# Patient Record
Sex: Male | Born: 1980 | Race: White | Hispanic: No | Marital: Married | Smoking: Former smoker
Health system: Southern US, Community
[De-identification: ages and names within clinical notes are randomized; demographics above are authoritative.]

## PROBLEM LIST (undated history)

## (undated) DIAGNOSIS — G8929 Other chronic pain: Secondary | ICD-10-CM

## (undated) DIAGNOSIS — M549 Dorsalgia, unspecified: Secondary | ICD-10-CM

## (undated) DIAGNOSIS — J45909 Unspecified asthma, uncomplicated: Secondary | ICD-10-CM

## (undated) HISTORY — PX: VASCULAR SURGERY: SHX849

## (undated) HISTORY — PX: SPINAL CORD STIMULATOR INSERTION: SHX5378

## (undated) HISTORY — PX: APPENDECTOMY: SHX54

## (undated) HISTORY — PX: RADIOFREQUENCY ABLATION NERVES: SUR1070

## (undated) HISTORY — PX: HAND SURGERY: SHX662

## (undated) HISTORY — PX: CHOLECYSTECTOMY: SHX55

## (undated) HISTORY — PX: OTHER SURGICAL HISTORY: SHX169

---

## 2000-10-19 ENCOUNTER — Encounter: Payer: Self-pay | Admitting: Emergency Medicine

## 2000-10-19 ENCOUNTER — Emergency Department (HOSPITAL_COMMUNITY): Admission: EM | Admit: 2000-10-19 | Discharge: 2000-10-19 | Payer: Self-pay | Admitting: Emergency Medicine

## 2001-06-03 ENCOUNTER — Emergency Department (HOSPITAL_COMMUNITY): Admission: EM | Admit: 2001-06-03 | Discharge: 2001-06-03 | Payer: Self-pay | Admitting: Emergency Medicine

## 2002-01-20 ENCOUNTER — Encounter: Payer: Self-pay | Admitting: *Deleted

## 2002-01-20 ENCOUNTER — Encounter: Admission: RE | Admit: 2002-01-20 | Discharge: 2002-01-20 | Payer: Self-pay | Admitting: *Deleted

## 2002-02-03 ENCOUNTER — Ambulatory Visit (HOSPITAL_COMMUNITY): Admission: RE | Admit: 2002-02-03 | Discharge: 2002-02-03 | Payer: Self-pay | Admitting: *Deleted

## 2006-04-28 ENCOUNTER — Emergency Department (HOSPITAL_COMMUNITY): Admission: EM | Admit: 2006-04-28 | Discharge: 2006-04-28 | Payer: Self-pay | Admitting: Emergency Medicine

## 2006-08-25 ENCOUNTER — Encounter: Admission: RE | Admit: 2006-08-25 | Discharge: 2006-08-25 | Payer: Self-pay | Admitting: Family Medicine

## 2006-08-25 ENCOUNTER — Encounter (INDEPENDENT_AMBULATORY_CARE_PROVIDER_SITE_OTHER): Payer: Self-pay | Admitting: General Surgery

## 2006-08-25 ENCOUNTER — Inpatient Hospital Stay (HOSPITAL_COMMUNITY): Admission: EM | Admit: 2006-08-25 | Discharge: 2006-08-26 | Payer: Self-pay | Admitting: Emergency Medicine

## 2006-11-09 ENCOUNTER — Encounter: Admission: RE | Admit: 2006-11-09 | Discharge: 2006-11-09 | Payer: Self-pay | Admitting: Family Medicine

## 2010-07-08 NOTE — H&P (Signed)
[name], [name]               ACCOUNT NO.:  [id number]   MEDICAL RECORD NO.:  [id number]          PATIENT TYPE:  INP   LOCATION:  1425                         FACILITY:  [facility]   PHYSICIAN:  Anselm Pancoast. Weatherly, M.D.DATE OF BIRTH:  [birth year]   DATE OF ADMISSION:  08/25/2006  DATE OF DISCHARGE:                              HISTORY & PHYSICAL   CHIEF COMPLAINT:  Abdominal pain worse right lower quadrant.   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  This morning, it was more  intense and he was seen by the physician's assistant at Dr. Alric Seton office who found him mildly tender in the lower abdomen.  A  CBC was obtained which was mildly elevated and then he was referred for  a CAT scan.  The CAT scan showed findings of acute appendicitis.  I was  called and he was sent to the emergency room and on questioning, he  states he has never had any similar pain like this and he has no  allergies.  The patient was given 3 gm of Unasyn at approximately 5  o'clock and received some pain medications.   PAST MEDICAL HISTORY:  He has got a history of asthma.  He is a  nonsmoker and nondrinker.  No use of recreational drugs.  Only previous  surgery was an I&D of a pilonidal cyst in the medical office several  years ago.   CHRONIC MEDICATIONS:  None.   PHYSICAL EXAMINATION:  GENERAL:  He is a pleasant male who appears his  stated age and he was kind of complaining of abdominal pain but it was  certainly not generalized acute abdomen.  VITAL SIGNS:  Blood pressure is 126/78.  Pulse 69.  He is afebrile at  97.6.  Respirations 20.  EYES, EARS, NOSE, AND THROAT:  He appears adequately hydrated.  There is  no cervical or supraclavicular lymphadenopathy.  LUNGS:  Clear.  CARDIAC:  Normal sinus rhythm.  ABDOMEN:  He is definitely mildly tender in  the right lower quadrant at  McBurney's point.  There are no umbilical hernias and no inguinal  hernias.  He has a fairly deep umbilicus but the fascia appears intact.  EXTREMITIES:  Unremarkable.  CNS:  Physiologic.   ADMISSION IMPRESSION:  Acute appendicitis confirmed with CT.   PLAN:  We will proceed to do a laparoscopic appendectomy when OR time is  available.           ______________________________  Anselm Pancoast. Zachery Dakins, M.D.     WJW/MEDQ  D:  08/25/2006  T:  08/26/2006  Job:  [id number]

## 2010-07-08 NOTE — Op Note (Signed)
[name] [name], MURTHA               ACCOUNT NO.:  [id number]   MEDICAL RECORD NO.:  [id number]          PATIENT TYPE:  INP   LOCATION:  1425                         FACILITY:  [facility]   PHYSICIAN:  Anselm Pancoast. Weatherly, M.D.DATE OF BIRTH:  01-Mar-1980   DATE OF PROCEDURE:  08/25/2006  DATE OF DISCHARGE:                               OPERATIVE REPORT   PREOPERATIVE DIAGNOSIS:  Acute appendicitis.   POSTOPERATIVE DIAGNOSIS:  Acute appendicitis.   OPERATIONS:  Laparoscopic appendectomy.   General anesthesia.   SURGEON:  Anselm Pancoast. Zachery Dakins, M.D.   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  This morning was more  intense and appeared to be shifting to the lower abdomen.  He was seen  in Dr. Alric Seton office and a mildly elevated white count and  then referred for CT that showed acute appendicitis.  The patient  presented to the emergency room and was seen about 5 o'clock, given 3 g  of Unasyn and permission obtained now for a laparoscopic appendectomy.   The patient was taken to the operative suite.  After induction of  general anesthesia and endotracheal tube, the abdomen was first clipped  and then prepped with Betadine solution and draped in a sterile manner.  I made a small vertical incision above the umbilicus.  The fascia was  identified.  This was picked up between two Kochers and very small  opening carefully made into the peritoneal cavity.  A suture superior  and inferior of 0 Vicryl was placed and a Hasson cannula introduced.  The the patient has an obvious inflammatory process in the right lower  quadrant but it does not look like problems with a ruptured appendix.  The 5 mm port was placed in the right upper quadrant with direct vision  and then the 10/11 was placed the left lower quadrant under direct  vision.  Camera was then the left lower quadrant and the cecum was kind  of rotated.   You could see the markedly inflamed appendix.  This was  grasped with a Kingsley Spittle and then the appendix was kind of looped over  but I could identify which was the tip and which was the base of the  cecum and then the appendiceal mesentery which was adherent, as if he  has had some previous episodes of discomfort, were carefully dissected  with the harmonic scalpel.  The appendix was then straightened out so  that the appendiceal mesentery could be divided with the harmonic  scalpel and good hemostasis was obtained.  The appendix was inflamed  right on to about an inch of its junction with the cecum and I took the  mesentery down to the cecum, then used a vascular GIA across the base.  The first staple did not fire.  removed it, got a new stapler.  This one  was reapplied just slightly more proximal and fired nicely.  Good  hemostasis, and the appendix was placed in the EndoCatch bag.  The bag  containing the inflamed appendix was removed and then  the Hasson cannula  reinserted.  Reinspected of all where the appendix had been removed, and  the mesentery revealed good hemostasis, good closure.  A little bit of  irrigating fluid was used and aspirated and then the 10/11 port was  first removed under direct vision and I put a 0 Vicryl figure-of-eight  suture in the anterior fascia.  There was no bleeding on checking on  entering the abdomen.  The 5-mm port was removed next, then the [facility]  cannula was removed.  I placed a figure-of-eight in the fascia in  addition to the two previous U stitches, and all of his 0 Vicryl  sutures were tied.  The fascia at the umbilicus and the left lower  quadrant was anesthetized.  The subcutaneous wounds were closed with 4-0  Vicryl, Benzoin and Steri-Strips on the skin.  The patient tolerated the  procedure nicely and, hopefully, will be ready to be discharged in the  a.m.           ______________________________  Anselm Pancoast. Zachery Dakins, M.D.      WJW/MEDQ  D:  08/25/2006  T:  08/26/2006  Job:  [id number]   cc:   Donia Guiles, M.D.  Fax: [phone]

## 2010-12-09 LAB — DIFFERENTIAL
Basophils Absolute: 0
Basophils Relative: 0
Eosinophils Relative: 1
Lymphocytes Relative: 20
Monocytes Absolute: 0.7
Monocytes Relative: 8

## 2010-12-09 LAB — CBC
HCT: 41.9
Hemoglobin: 14.2
MCHC: 33.9
RBC: 4.98
RDW: 12.8

## 2016-06-17 ENCOUNTER — Other Ambulatory Visit: Payer: Self-pay | Admitting: Anesthesiology

## 2016-06-17 DIAGNOSIS — M5137 Other intervertebral disc degeneration, lumbosacral region: Secondary | ICD-10-CM

## 2016-06-30 DIAGNOSIS — G8929 Other chronic pain: Secondary | ICD-10-CM | POA: Insufficient documentation

## 2016-07-13 ENCOUNTER — Ambulatory Visit
Admission: RE | Admit: 2016-07-13 | Discharge: 2016-07-13 | Disposition: A | Discharge status: Home / Self Care | Payer: Managed Care, Other (non HMO) | Source: Ambulatory Visit | Attending: Anesthesiology | Admitting: Anesthesiology

## 2016-07-13 ENCOUNTER — Other Ambulatory Visit: Payer: Self-pay

## 2016-07-13 DIAGNOSIS — M5137 Other intervertebral disc degeneration, lumbosacral region: Secondary | ICD-10-CM

## 2016-07-22 ENCOUNTER — Other Ambulatory Visit: Payer: Self-pay | Admitting: Neurological Surgery

## 2016-08-12 ENCOUNTER — Inpatient Hospital Stay (HOSPITAL_COMMUNITY): Admission: RE | Admit: 2016-08-12 | Payer: Managed Care, Other (non HMO) | Source: Ambulatory Visit

## 2016-08-13 ENCOUNTER — Emergency Department (HOSPITAL_COMMUNITY)
Admission: EM | Admit: 2016-08-13 | Discharge: 2016-08-13 | Disposition: A | Discharge status: Home / Self Care | Payer: Managed Care, Other (non HMO) | Attending: Emergency Medicine | Admitting: Emergency Medicine

## 2016-08-13 ENCOUNTER — Encounter (HOSPITAL_COMMUNITY): Payer: Self-pay | Admitting: Emergency Medicine

## 2016-08-13 DIAGNOSIS — M5430 Sciatica, unspecified side: Secondary | ICD-10-CM | POA: Diagnosis not present

## 2016-08-13 DIAGNOSIS — M545 Low back pain: Secondary | ICD-10-CM | POA: Diagnosis present

## 2016-08-13 DIAGNOSIS — M5136 Other intervertebral disc degeneration, lumbar region: Secondary | ICD-10-CM | POA: Diagnosis not present

## 2016-08-13 MED ORDER — PREDNISONE 20 MG PO TABS
60.0000 mg | ORAL_TABLET | Freq: Once | ORAL | Status: AC
  Administered 2016-08-13: 60 mg via ORAL
  Filled 2016-08-13: qty 3

## 2016-08-13 MED ORDER — HYDROCODONE-ACETAMINOPHEN 5-325 MG PO TABS: 1.0000 | ORAL_TABLET | Freq: Four times a day (QID) | ORAL | 0 refills | Status: AC | PRN

## 2016-08-13 MED ORDER — LORAZEPAM 1 MG PO TABS
1.0000 mg | ORAL_TABLET | Freq: Once | ORAL | Status: AC
  Administered 2016-08-13: 1 mg via ORAL
  Filled 2016-08-13: qty 1

## 2016-08-13 MED ORDER — PREDNISONE 20 MG PO TABS: ORAL_TABLET | ORAL | 0 refills | Status: DC

## 2016-08-13 MED ORDER — HYDROMORPHONE HCL 1 MG/ML IJ SOLN
1.0000 mg | Freq: Once | INTRAMUSCULAR | Status: AC
  Administered 2016-08-13: 1 mg via INTRAMUSCULAR
  Filled 2016-08-13: qty 1

## 2016-08-13 NOTE — ED Triage Notes (Signed)
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 Pt states he has severe nerve pain. Pts MD wants to do lumbar fusion but his insurance company is denying covering it. Pt states he is in such severe pain and cannot handle it. Pt explains he can barely walk with the pain.

## 2016-08-13 NOTE — ED Notes (Signed)
This RN had to pick pt up out of the wheelchair and place him in the bed. Pt is moaning while moving him.

## 2016-08-13 NOTE — Discharge Instructions (Signed)
It was our pleasure to provide your ER care today - we hope that you feel better.  Take prednisone as prescribed.  You may take hydrocodone as need for pain. No driving for the next 6 hours or when taking hydrocodone. Also, do not take tylenol or acetaminophen containing medication when taking hydrocodone.  Rest. Avoid heavy lifting > 20 lbs.  Try heat therapy.  Discuss trying physical therapy for pain improvement with your doctor.  Follow up with your back specialist this Monday as planned.  Return to ER if worse, new symptoms, fevers, numbness/weakness, other concern.

## 2016-08-13 NOTE — ED Provider Notes (Addendum)
[facility]-EMERGENCY DEPT Provider Note   CSN: [id number] Arrival date & time: 08/13/16  0808     History   Chief Complaint Chief Complaint  Patient presents with  . Back Pain    HPI [name] [name] is a 36 y.o. male.  Patient with hx chronic low back pain c/o increased pain in the past 10-11 weeks.  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 No acute trauma or fall. Pain dull, severe, radiates to bilateral legs. No numbness/weakness. No change in normal bowel or bladder function. Denies fever or chills. States problems w insurance approval have cancelled plans for surgery. Has plans to see Dr Yetta BarreJones, neurosurgery, this Monday. States has tried prior injections without relief.  Notes multiple prior imaging studies this year for same.    The history is provided by the patient.  Back Pain   Pertinent negatives include no chest pain, no fever, no numbness, no abdominal pain and no weakness.    History reviewed. No pertinent past medical history.  There are no active problems to display for this patient.   Past Surgical History:  Procedure Laterality Date  . APPENDECTOMY    . sinusectomy    . VASCULAR SURGERY         Home Medications    Prior to Admission medications   Medication Sig Start Date End Date Taking? Authorizing Provider  docusate sodium (COLACE) 100 MG capsule Take 300 mg by mouth daily.    [provider]  eletriptan (RELPAX) 40 MG tablet Take 40 mg by mouth every 2 (two) hours as needed for migraine or headache. May repeat in 2 hours if headache persists or recurs.    [provider]  LYRICA 75 MG capsule Take 150 mg by mouth 2 (two) times daily. 07/10/16   [provider]  oxyCODONE-acetaminophen (PERCOCET) 7.5-325 MG tablet Take 1 tablet by mouth every 6 (six) hours as needed for severe pain.    [provider]  oxymetazoline (AFRIN) 0.05 % nasal spray Place 1 spray into both nostrils  2 (two) times daily as needed for congestion.    [provider]  polyethylene glycol (MIRALAX / GLYCOLAX) packet Take 17 g by mouth daily.    [provider]  SUMAtriptan Succinate (ONZETRA XSAIL) 11 MG/NOSEPC EXHP Place 1 spray into both nostrils daily as needed (migraine).    [provider]    Family History History reviewed. No pertinent family history.  Social History Social History  Substance Use Topics  . Smoking status: Never Smoker  . Smokeless tobacco: Never Used  . Alcohol use No     Allergies   Cinnamon   Review of Systems Review of Systems  Constitutional: Negative for fever.  HENT: Negative for sore throat.   Eyes: Negative for redness.  Respiratory: Negative for shortness of breath.   Cardiovascular: Negative for chest pain.  Gastrointestinal: Negative for abdominal pain.  Genitourinary: Negative for flank pain.  Musculoskeletal: Positive for back pain.  Skin: Negative for rash.  Neurological: Negative for weakness and numbness.  Hematological: Does not bruise/bleed easily.  Psychiatric/Behavioral: Negative for confusion.     Physical Exam Updated Vital Signs BP 125/85 (BP Location: Left Arm)   Pulse (!) 103   Temp 98 F (36.7 C) (Oral)   Resp (!) 22   Ht 1.727 m (5\' 8" )   Wt 77.1 kg (170 lb)   SpO2 100%   BMI 25.85 kg/m   Physical Exam  Constitutional: He appears well-developed and well-nourished. No distress.  HENT:  Head: Atraumatic.  Eyes: Conjunctivae are normal.  Neck: Neck supple. No tracheal deviation present.  Cardiovascular: Normal rate and intact distal pulses.   Pulmonary/Chest: Effort normal and breath sounds normal. No accessory muscle usage. No respiratory distress.  Abdominal: Soft. Bowel sounds are normal. He exhibits no distension. There is no tenderness.  Genitourinary:  Genitourinary Comments: No cva tenderness  Musculoskeletal: He exhibits no edema.  TLS spine non tender. +bil lumbar  muscular tenderness.  Distal pulses palp. Good rom and hip/knee without pain.   Neurological: He is alert.  Speech clear. Motor intact bil legs, stre 5/5 throughout. sens grossly intact. Symmetric reflexes.   Skin: Skin is warm and dry. He is not diaphoretic.  Psychiatric: He has a normal mood and affect.  Nursing note and vitals reviewed.    ED Treatments / Results  Labs (all labs ordered are listed, but only abnormal results are displayed) Labs Reviewed - No data to display  EKG  EKG Interpretation None       Radiology No results found.  Procedures Procedures (including critical care time)  Medications Ordered in ED Medications  HYDROmorphone (DILAUDID) injection 1 mg (not administered)  predniSONE (DELTASONE) tablet 60 mg (not administered)  LORazepam (ATIVAN) tablet 1 mg (not administered)     Initial Impression / Assessment and Plan / ED Course  I have reviewed the triage vital signs and the nursing notes.  Pertinent labs & imaging results that were available during my care of the patient were reviewed by me and considered in my medical decision making (see chart for details).  Pt has ride, does not have to drive.    Dilaudid 1 mg im.   Prednisone po.  Also appears anxious. Ativan 1 mg po.  Reviewed nursing notes and prior charts for additional history.   Several imaging studies in past several months, including mri, discogram, ct - pt with DDD, but no acute/emergent abn on imaging studies.    Pain improved.  Pt appears stable for d/c, w f/u with his neurosurgeon this Monday.    Final Clinical Impressions(s) / ED Diagnoses   Final diagnoses:  None    New Prescriptions New Prescriptions   No medications on file         Cathren Laine, MD 08/13/16 1026

## 2016-08-13 NOTE — ED Notes (Signed)
Pt is in stable condition upon d/c and is escorted from ED via wheelchair. 

## 2016-08-20 ENCOUNTER — Encounter (HOSPITAL_COMMUNITY): Admission: RE | Payer: Self-pay | Source: Ambulatory Visit

## 2016-08-20 ENCOUNTER — Inpatient Hospital Stay (HOSPITAL_COMMUNITY)
Admission: RE | Admit: 2016-08-20 | Payer: Managed Care, Other (non HMO) | Source: Ambulatory Visit | Admitting: Neurological Surgery

## 2016-08-20 SURGERY — POSTERIOR LUMBAR FUSION 2 LEVEL
Anesthesia: General | Site: Back

## 2016-09-07 ENCOUNTER — Other Ambulatory Visit: Payer: Self-pay | Admitting: Physician Assistant

## 2016-09-07 DIAGNOSIS — M5415 Radiculopathy, thoracolumbar region: Secondary | ICD-10-CM

## 2016-09-14 ENCOUNTER — Ambulatory Visit
Admission: RE | Admit: 2016-09-14 | Discharge: 2016-09-14 | Disposition: A | Discharge status: Home / Self Care | Payer: Managed Care, Other (non HMO) | Source: Ambulatory Visit | Attending: Physician Assistant | Admitting: Physician Assistant

## 2016-09-14 DIAGNOSIS — M5415 Radiculopathy, thoracolumbar region: Secondary | ICD-10-CM

## 2016-09-29 DIAGNOSIS — M79605 Pain in left leg: Secondary | ICD-10-CM | POA: Insufficient documentation

## 2016-09-29 DIAGNOSIS — F40240 Claustrophobia: Secondary | ICD-10-CM | POA: Insufficient documentation

## 2017-03-31 DIAGNOSIS — M5416 Radiculopathy, lumbar region: Secondary | ICD-10-CM | POA: Insufficient documentation

## 2017-04-25 ENCOUNTER — Emergency Department (HOSPITAL_COMMUNITY)
Admission: EM | Admit: 2017-04-25 | Discharge: 2017-04-25 | Disposition: A | Discharge status: Home / Self Care | Payer: 59 | Attending: Emergency Medicine | Admitting: Emergency Medicine

## 2017-04-25 ENCOUNTER — Encounter (HOSPITAL_COMMUNITY): Payer: Self-pay | Admitting: *Deleted

## 2017-04-25 DIAGNOSIS — Z79899 Other long term (current) drug therapy: Secondary | ICD-10-CM | POA: Insufficient documentation

## 2017-04-25 DIAGNOSIS — R112 Nausea with vomiting, unspecified: Secondary | ICD-10-CM | POA: Diagnosis not present

## 2017-04-25 DIAGNOSIS — H53149 Visual discomfort, unspecified: Secondary | ICD-10-CM | POA: Insufficient documentation

## 2017-04-25 DIAGNOSIS — G971 Other reaction to spinal and lumbar puncture: Secondary | ICD-10-CM | POA: Insufficient documentation

## 2017-04-25 DIAGNOSIS — R51 Headache: Secondary | ICD-10-CM | POA: Diagnosis present

## 2017-04-25 MED ORDER — KETOROLAC TROMETHAMINE 30 MG/ML IJ SOLN
30.0000 mg | Freq: Once | INTRAMUSCULAR | Status: AC
  Administered 2017-04-25: 30 mg via INTRAVENOUS
  Filled 2017-04-25: qty 1

## 2017-04-25 MED ORDER — DIPHENHYDRAMINE HCL 50 MG/ML IJ SOLN
25.0000 mg | Freq: Once | INTRAMUSCULAR | Status: AC
  Administered 2017-04-25: 25 mg via INTRAVENOUS
  Filled 2017-04-25: qty 1

## 2017-04-25 MED ORDER — SODIUM CHLORIDE 0.9 % IV BOLUS (SEPSIS)
1000.0000 mL | Freq: Once | INTRAVENOUS | Status: AC
  Administered 2017-04-25: 1000 mL via INTRAVENOUS

## 2017-04-25 MED ORDER — METOCLOPRAMIDE HCL 5 MG/ML IJ SOLN
10.0000 mg | Freq: Once | INTRAMUSCULAR | Status: AC
Start: 2017-04-25 — End: 2017-04-25
  Administered 2017-04-25: 10 mg via INTRAVENOUS
  Filled 2017-04-25: qty 2

## 2017-04-25 MED ORDER — BUTALBITAL-APAP-CAFFEINE 50-325-40 MG PO TABS: 1.0000 | ORAL_TABLET | Freq: Four times a day (QID) | ORAL | 0 refills | Status: AC | PRN

## 2017-04-25 NOTE — ED Provider Notes (Signed)
[facility]-EMERGENCY DEPT Provider Note   CSN: [id number] Arrival date & time: 04/25/17  0859     History   Chief Complaint Chief Complaint  Patient presents with  . Headache    post myelogram    HPI [name] [name] is a 37 y.o. male medical history of thoracolumbar radiculopathy, presenting to the ED with persistent headache since 04/21/2017.  Patient had myelogram performed at [facility] on 2/27, and reports having headache with nausea and vomiting on Wednesday and Thursday.  He states Friday and Saturday the nausea and vomiting subsided, however he has continued to have headache. HA is significantly worse when sitting upright and improved with laying flat.  He states the headache is located in the back of his head at the top of his neck, described as pressure.  He states he drank a lot of caffeine yesterday which provided some relief of headache, however the caffeine is not helped much today.  Is not taking anything else for pain other than oxycodone once yesterday for radicular pain down legs, and his daily gabapentin.  Reports associated photophobia.  Denies new weakness in extremities, vision changes, abdominal pain, swelling or redness at procedure site.  States he has had an ESI injection in the past with similar side effects requiring a blood patch.  Per chart review, patient seen by neurosurgery at [facility], Dr. Gearlean Alf, for chronic thoracolumbar radiculopathy.  Patient is noted to have spinal stimulator for chronic pain, and is also noted to have chronic left leg weakness.  Patient had myelogram done on 04/21/2017 at [facility].  The history is provided by the patient.    History reviewed. No pertinent past medical history.  There are no active problems to display for this patient.   Past Surgical History:  Procedure Laterality Date  . APPENDECTOMY    . sinusectomy    . VASCULAR SURGERY         Home Medications    Prior to Admission medications   Medication  Sig Start Date End Date Taking? Authorizing Provider  cetirizine (ZYRTEC) 10 MG tablet Take 10 mg by mouth daily.   Yes [provider]  diphenhydrAMINE (BENADRYL) 25 MG tablet Take 25 mg by mouth every 6 (six) hours as needed for itching.   Yes [provider]  eletriptan (RELPAX) 40 MG tablet Take 40 mg by mouth every 2 (two) hours as needed for migraine or headache. May repeat in 2 hours if headache persists or recurs.   Yes [provider]  gabapentin (NEURONTIN) 300 MG capsule Take 300 mg by mouth 3 (three) times daily. 04/24/17  Yes [provider]  oxyCODONE-acetaminophen (PERCOCET/ROXICET) 5-325 MG tablet Take 1 tablet by mouth every 6 (six) hours as needed for severe pain. 03/19/17  Yes [provider]  oxymetazoline (AFRIN) 0.05 % nasal spray Place 1 spray into both nostrils 2 (two) times daily as needed for congestion.   Yes [provider]  Phenylephrine-DM-GG-APAP (VICKS DAYQUIL SEVERE COLD/FLU) 5-10-200-325 MG CAPS Take 2 capsules by mouth daily as needed (cold symptoms).   Yes [provider]  polyethylene glycol (MIRALAX / GLYCOLAX) packet Take 17 g by mouth every other day.    Yes [provider]  SUMAtriptan Succinate (ONZETRA XSAIL) 11 MG/NOSEPC EXHP Place 1 spray into both nostrils daily as needed (migraine).   Yes [provider]  traMADol (ULTRAM) 50 MG tablet Take 50-100 mg by mouth every 8 (eight) hours as needed for moderate pain. 03/19/17  Yes  [provider]  butalbital-acetaminophen-caffeine (FIORICET, ESGIC) 50-325-40 MG tablet Take 1-2 tablets by mouth every 6 (six) hours as needed for headache. 04/25/17   Samanda Buske, [place] N, PA-C  HYDROcodone-acetaminophen (NORCO/VICODIN) 5-325 MG tablet Take 1-2 tablets by mouth every 6 (six) hours as needed for moderate pain. Patient not taking: Reported on 04/25/2017 08/13/16   Cathren Laine, MD  predniSONE (DELTASONE) 20 MG tablet 3 po once a day for 2  days, then 2 po once a day for 3 days, then 1 po once a day for 3 days Patient not taking: Reported on 04/25/2017 08/13/16   Cathren Laine, MD    Family History No family history on file.  Social History Social History   Tobacco Use  . Smoking status: Never Smoker  . Smokeless tobacco: Never Used  Substance Use Topics  . Alcohol use: No  . Drug use: No     Allergies   Hydrocodone; Oxycodone; and Cinnamon   Review of Systems Review of Systems  Constitutional: Negative for chills and fever.  Eyes: Positive for photophobia. Negative for visual disturbance.  Gastrointestinal: Positive for nausea (resolved) and vomiting (resolved). Negative for abdominal pain.  Musculoskeletal: Positive for back pain (chronic).  Neurological: Positive for weakness (chronic LLE) and headaches. Negative for syncope.  All other systems reviewed and are negative.    Physical Exam Updated Vital Signs BP 113/70   Pulse 67   Temp 98 F (36.7 C) (Oral)   Resp 16   Ht 5\' 8"  (1.727 m)   Wt 72.6 kg (160 lb)   SpO2 99%   BMI 24.33 kg/m   Physical Exam  Constitutional: He appears well-developed and well-nourished. He does not appear ill.  HENT:  Head: Normocephalic and atraumatic.  Eyes: Conjunctivae are normal.  Neck: Normal range of motion. Neck supple.  Cardiovascular: Normal rate, regular rhythm, normal heart sounds and intact distal pulses.  Pulmonary/Chest: Effort normal and breath sounds normal. No respiratory distress.  Abdominal: Soft. Bowel sounds are normal. He exhibits no distension. There is no tenderness.  Musculoskeletal:  No midline spinal tenderness. 3cm surgical scar left of midline in lumbar region. No abscess, erythema, edema, or warmth  Neurological: He is alert.  Mental Status:  Alert, oriented, thought content appropriate, able to give a coherent history. Speech fluent without evidence of aphasia. Able to follow 2 step commands without difficulty.  Cranial Nerves:  II:   Peripheral visual fields grossly normal, pupils equal, round, reactive to light III,IV, VI: ptosis not present, extra-ocular motions intact bilaterally  V,VII: smile symmetric, facial light touch sensation equal VIII: hearing grossly normal to voice  X: uvula elevates symmetrically  XI: bilateral shoulder shrug symmetric and strong XII: midline tongue extension without fassiculations Motor:  Normal tone. 5/5 in upper  extremities bilaterally including strong and equal grip strength. 5/5 strength RLE and 3/5 strength LLE with hip flexion. 5/5 strength BLE with dorsiflexion/plantar flexion. Sensory: grossly normal in all extremities.  Deep Tendon Reflexes: 2+ and symmetric in the biceps and patella Cerebellar: normal finger-to-nose with bilateral upper extremities Unable to assess gait 2/t pt HA worse with upright position. CV: distal pulses palpable throughout    Skin: Skin is warm.  Psychiatric: He has a normal mood and affect. His behavior is normal.  Nursing note and vitals reviewed.    ED Treatments / Results  Labs (all labs ordered are listed, but only abnormal results are displayed) Labs Reviewed - No data to display  EKG  EKG Interpretation None  Radiology No results found.  Procedures Procedures (including critical care time)  Medications Ordered in ED Medications  sodium chloride 0.9 % bolus 1,000 mL (0 mLs Intravenous Stopped 04/25/17 1141)  metoCLOPramide (REGLAN) injection 10 mg (10 mg Intravenous Given 04/25/17 1001)  ketorolac (TORADOL) 30 MG/ML injection 30 mg (30 mg Intravenous Given 04/25/17 1001)  diphenhydrAMINE (BENADRYL) injection 25 mg (25 mg Intravenous Given 04/25/17 1001)     Initial Impression / Assessment and Plan / ED Course  I have reviewed the triage vital signs and the nursing notes.  Pertinent labs & imaging results that were available during my care of the patient were reviewed by me and considered in my medical decision making (see  chart for details).     Pt presenting with HA after myelogram on 04/21/17. HA is worse when sitting upright, better when laying flat. Denies vision changes or new extremity weakness. No new focal neuro deficits on exam (chronic LLE weakness). Afebrile. HA treated in ED with improvement. Pt sat up and stating HA is much better. Pt discussed with Dr. Eudelia Bunchardama. Will discharge with fioricet for HA and instructions to orally hydrate. Pt has appt with his neurosurgeon on Tuesday. Pt will call tomorrow for earlier appt if possible. Strict return precautions discussed. Safe for discharge.  Discussed results, findings, treatment and follow up. Patient advised of return precautions. Patient verbalized understanding and agreed with plan.  Final Clinical Impressions(s) / ED Diagnoses   Final diagnoses:  Spinal headache    ED Discharge Orders        Ordered    butalbital-acetaminophen-caffeine (FIORICET, ESGIC) 50-325-40 MG tablet  Every 6 hours PRN     04/25/17 1141       Camy Leder, [place] N, [place] JerseyPA-C 04/25/17 1143    Nira Connardama, Pedro Eduardo, MD 04/25/17 1747

## 2017-04-25 NOTE — Discharge Instructions (Signed)
Please read instructions below. You can take 1-2 tablets of fioricet eery 6 hours as needed for headache.  It is important to stay well-hydrated, as this will help your headache. Follow up with your neurosurgeon regarding your visit today. Return to the ER for severely worsening headache, vision changes, fever, new weakness or numbness, confusion, or new or concerning symptoms.

## 2017-04-25 NOTE — ED Triage Notes (Signed)
Pt complains of headache and photosensitivity since [facility]. Pt states the headache is relieved by laying down flat.

## 2017-12-19 IMAGING — MR MR THORACIC SPINE W/O CM
4 of 5 series · 17 of 48 positions shown · non-contrast
Comparison: Chest radiographs 04/14/2015.

CLINICAL DATA: Chronic mid back pain for 15 years. Worsening pain
since exertion 3 months ago. Difficulty with bowels and bladder
incontinence since this injury.

EXAM:
MRI THORACIC SPINE WITHOUT CONTRAST
TECHNIQUE: Multiplanar, multisequence MR imaging of the thoracic spine was
performed. No intravenous contrast was administered.

[Series 4: STIR · sagittal · 3.0mm · 0.94mm/px · 3 of 13 slices shown]
[im 3/13]
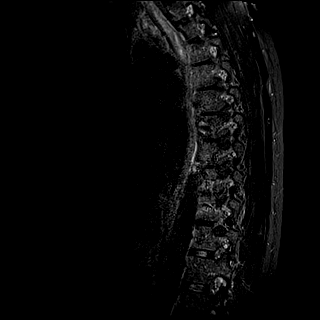
[im 8/13]
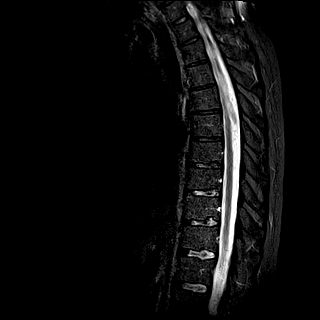
[im 13/13]
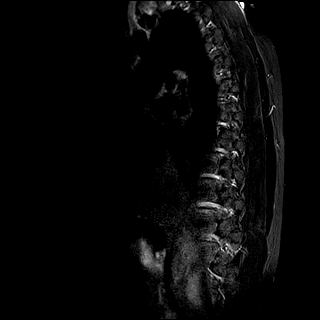

[Series 5: T1 · sagittal · 3.0mm · 0.47mm/px · 3 of 13 slices shown]
[im 3/13]
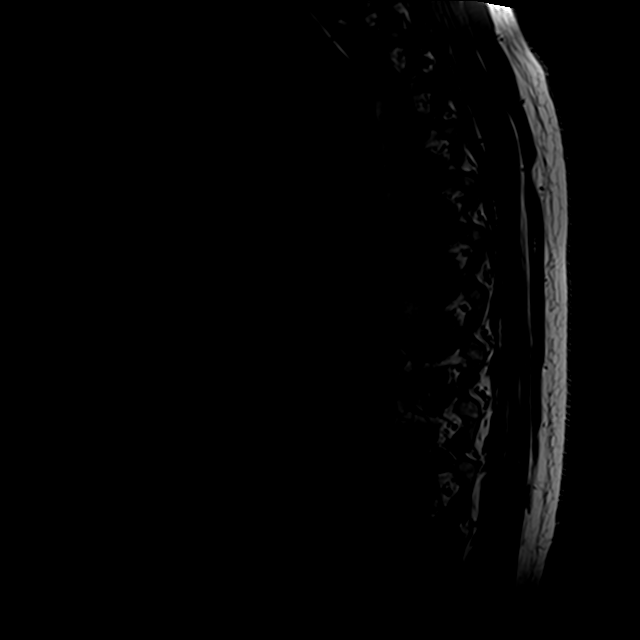
[im 7/13]
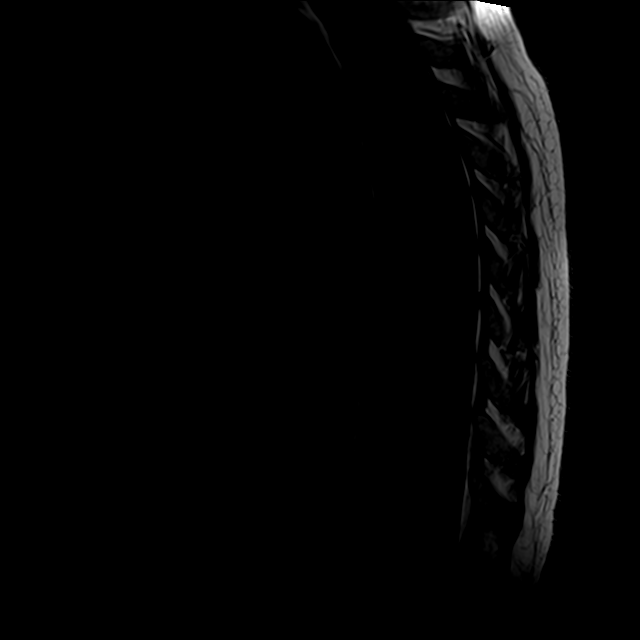
[im 11/13]
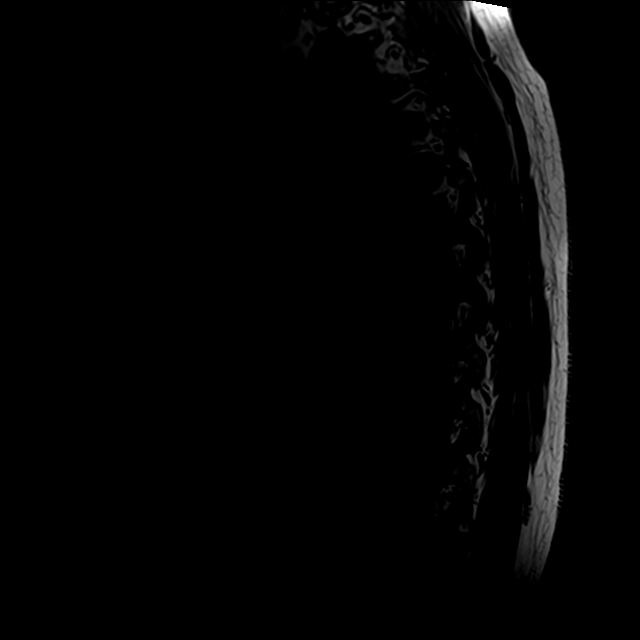

[Series 6: T2 · sagittal · 3.0mm · 0.47mm/px · 7 of 13 slices shown (1 of 2)]
[im 1/13]
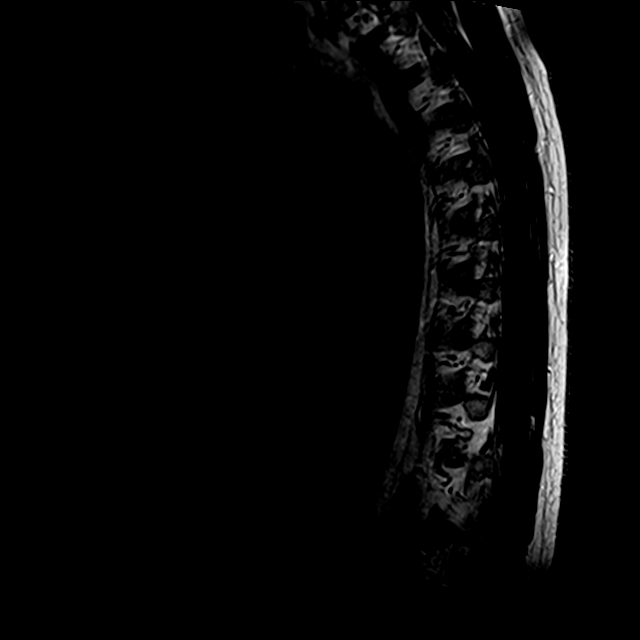
[im 3/13]
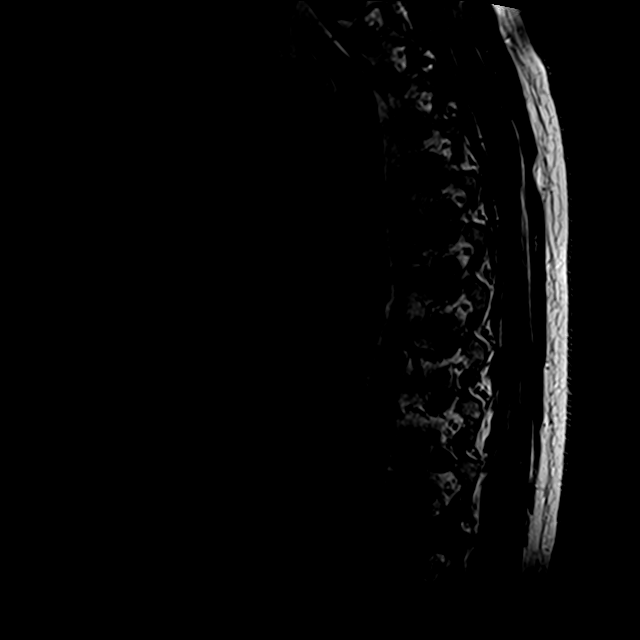
[im 5/13]
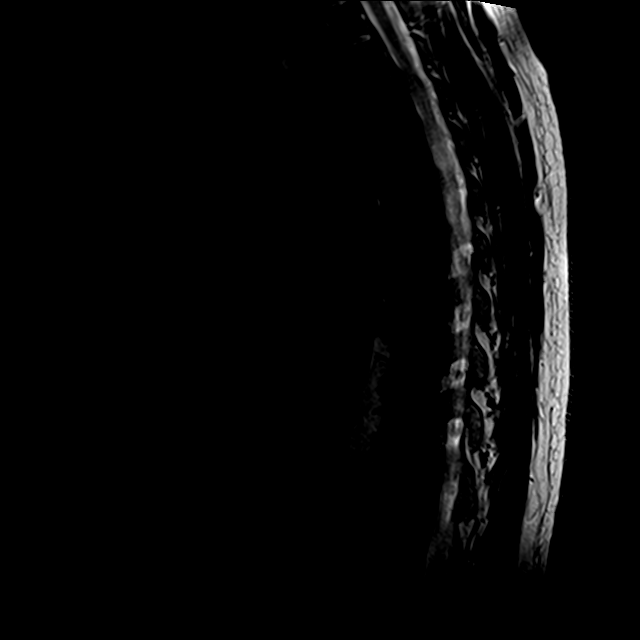
[im 7/13]
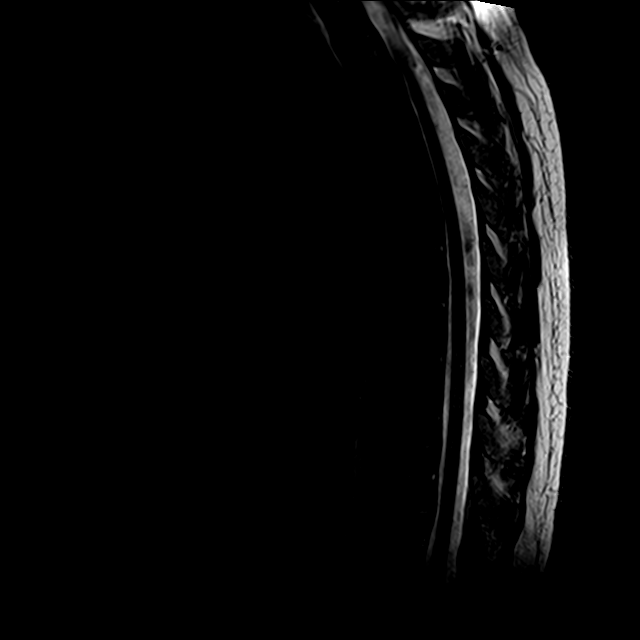
[im 9/13]
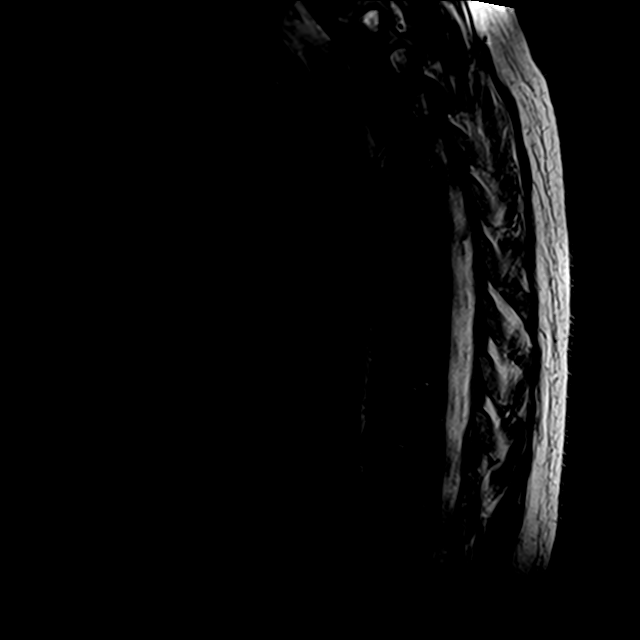
[im 11/13]
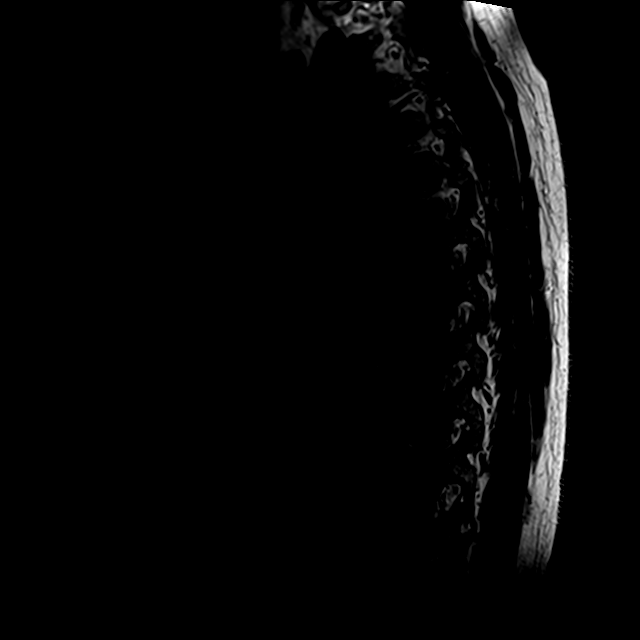
[im 13/13]
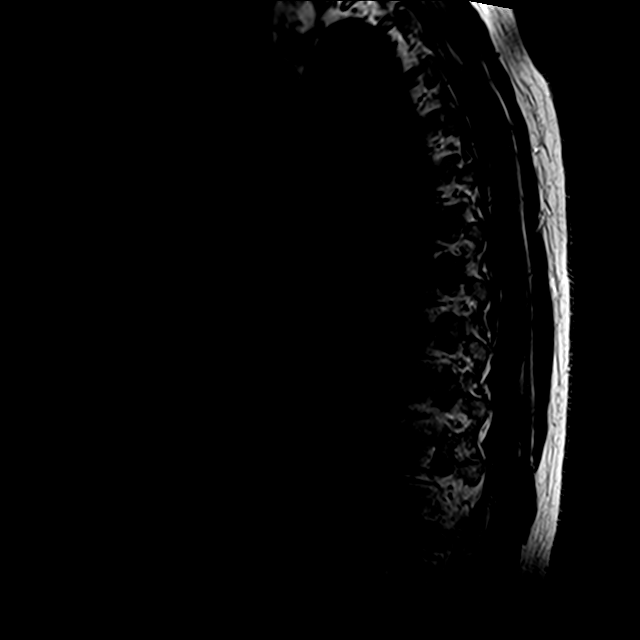

[Series 7: T2 · axial · 4.0mm · 0.39mm/px · z∈[-298,-132]mm · 4 of 28 slices shown (2 of 2)]
[im 1/28]
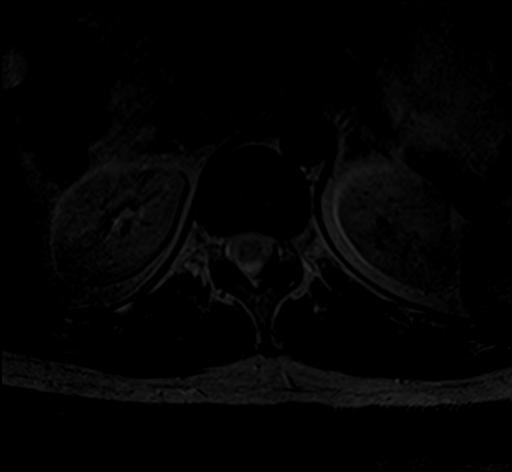
[im 5/28]
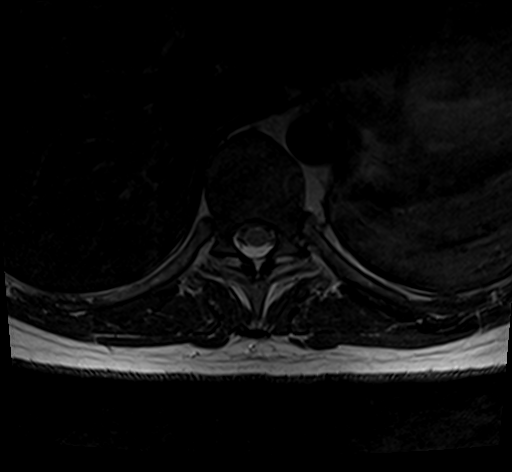
[im 15/28]
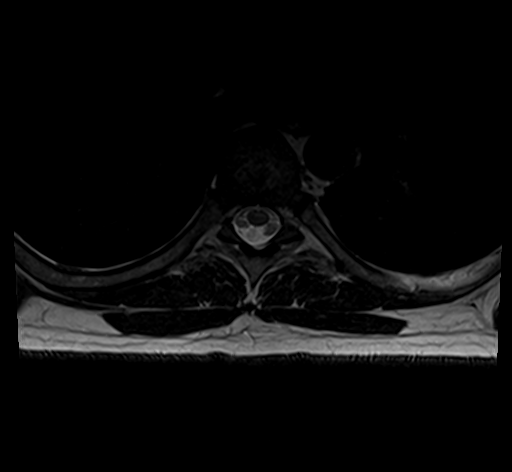
[im 23/28]
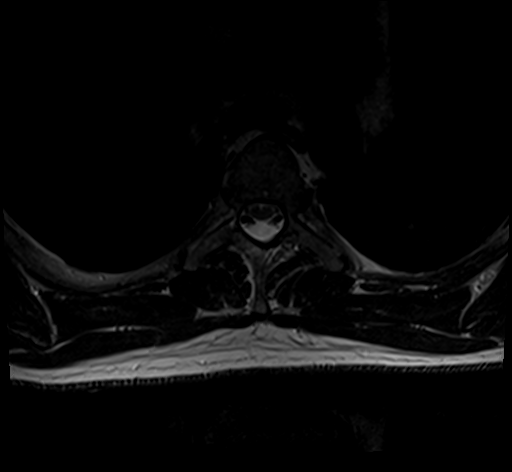

[17 of 48 positions shown; findings below may reference images not displayed]

FINDINGS: Alignment:  Normal.

Vertebrae: No acute or suspicious osseous findings.

Cord: The thoracic cord appears normal in signal and caliber. The
conus medullaris extends inferior to the T12 vertebral body,
incompletely visualized. CSF flow artifact noted.

Paraspinal and other soft tissues: No significant paraspinal
abnormalities.

Disc levels:

There are tiny central disc protrusions at T2-3, T4-5, T5-6, T6-7
and T7-8. There is no cord deformity or foraminal compromise.
IMPRESSION: 1. No acute findings or explanation for the patient's symptoms.
2. Several tiny disc protrusions without cord deformity or nerve
root encroachment.
3. No abnormal cord signal.

## 2018-01-18 DIAGNOSIS — E785 Hyperlipidemia, unspecified: Secondary | ICD-10-CM | POA: Insufficient documentation

## 2019-02-01 DIAGNOSIS — R0681 Apnea, not elsewhere classified: Secondary | ICD-10-CM | POA: Insufficient documentation

## 2019-02-22 DIAGNOSIS — R0683 Snoring: Secondary | ICD-10-CM | POA: Insufficient documentation

## 2019-02-22 DIAGNOSIS — R400 Somnolence: Secondary | ICD-10-CM | POA: Insufficient documentation

## 2019-02-22 DIAGNOSIS — G479 Sleep disorder, unspecified: Secondary | ICD-10-CM | POA: Insufficient documentation

## 2019-03-07 DIAGNOSIS — R0789 Other chest pain: Secondary | ICD-10-CM | POA: Insufficient documentation

## 2019-03-07 DIAGNOSIS — R262 Difficulty in walking, not elsewhere classified: Secondary | ICD-10-CM | POA: Insufficient documentation

## 2019-03-07 DIAGNOSIS — Z8249 Family history of ischemic heart disease and other diseases of the circulatory system: Secondary | ICD-10-CM | POA: Insufficient documentation

## 2019-03-07 DIAGNOSIS — R0602 Shortness of breath: Secondary | ICD-10-CM | POA: Insufficient documentation

## 2019-04-03 ENCOUNTER — Observation Stay (HOSPITAL_COMMUNITY)
Admission: EM | Admit: 2019-04-03 | Discharge: 2019-04-04 | Disposition: A | Discharge status: Home / Self Care | Payer: 59 | Attending: Orthopaedic Surgery | Admitting: Orthopaedic Surgery

## 2019-04-03 ENCOUNTER — Emergency Department (HOSPITAL_COMMUNITY): Payer: 59 | Admitting: Certified Registered Nurse Anesthetist

## 2019-04-03 ENCOUNTER — Encounter (HOSPITAL_COMMUNITY)
Admission: EM | Disposition: A | Discharge status: Home / Self Care | Payer: Self-pay | Source: Home / Self Care | Attending: Orthopaedic Surgery

## 2019-04-03 ENCOUNTER — Other Ambulatory Visit: Payer: Self-pay

## 2019-04-03 ENCOUNTER — Encounter (HOSPITAL_COMMUNITY): Payer: Self-pay | Admitting: Anesthesiology

## 2019-04-03 ENCOUNTER — Emergency Department (HOSPITAL_COMMUNITY): Payer: 59

## 2019-04-03 DIAGNOSIS — S66822A Laceration of other specified muscles, fascia and tendons at wrist and hand level, left hand, initial encounter: Secondary | ICD-10-CM

## 2019-04-03 DIAGNOSIS — Y990 Civilian activity done for income or pay: Secondary | ICD-10-CM | POA: Insufficient documentation

## 2019-04-03 DIAGNOSIS — S62301B Unspecified fracture of second metacarpal bone, left hand, initial encounter for open fracture: Secondary | ICD-10-CM | POA: Diagnosis not present

## 2019-04-03 DIAGNOSIS — U071 COVID-19: Secondary | ICD-10-CM

## 2019-04-03 DIAGNOSIS — K219 Gastro-esophageal reflux disease without esophagitis: Secondary | ICD-10-CM | POA: Insufficient documentation

## 2019-04-03 DIAGNOSIS — S61402A Unspecified open wound of left hand, initial encounter: Secondary | ICD-10-CM

## 2019-04-03 DIAGNOSIS — W312XXA Contact with powered woodworking and forming machines, initial encounter: Secondary | ICD-10-CM | POA: Diagnosis not present

## 2019-04-03 DIAGNOSIS — S61412A Laceration without foreign body of left hand, initial encounter: Secondary | ICD-10-CM

## 2019-04-03 DIAGNOSIS — S62615B Displaced fracture of proximal phalanx of left ring finger, initial encounter for open fracture: Secondary | ICD-10-CM | POA: Diagnosis not present

## 2019-04-03 DIAGNOSIS — F329 Major depressive disorder, single episode, unspecified: Secondary | ICD-10-CM | POA: Insufficient documentation

## 2019-04-03 DIAGNOSIS — Z9889 Other specified postprocedural states: Secondary | ICD-10-CM

## 2019-04-03 DIAGNOSIS — Z79899 Other long term (current) drug therapy: Secondary | ICD-10-CM | POA: Diagnosis not present

## 2019-04-03 DIAGNOSIS — S62303B Unspecified fracture of third metacarpal bone, left hand, initial encounter for open fracture: Secondary | ICD-10-CM | POA: Diagnosis not present

## 2019-04-03 DIAGNOSIS — S68127A Partial traumatic metacarpophalangeal amputation of left little finger, initial encounter: Secondary | ICD-10-CM | POA: Diagnosis not present

## 2019-04-03 HISTORY — PX: AMPUTATION FINGER: SHX6594

## 2019-04-03 HISTORY — PX: ULNAR COLLATERAL LIGAMENT REPAIR: SHX6159

## 2019-04-03 HISTORY — PX: I & D EXTREMITY: SHX5045

## 2019-04-03 HISTORY — PX: OPEN REDUCTION INTERNAL FIXATION (ORIF) METACARPAL: SHX6234

## 2019-04-03 HISTORY — PX: MINOR OPEN REDUCTION INTERNAL FIXATION (ORIF) FINGER: SHX6684

## 2019-04-03 HISTORY — PX: FLEXOR TENDON REPAIR: SHX6501

## 2019-04-03 HISTORY — PX: REPAIR EXTENSOR TENDON: SHX5382

## 2019-04-03 LAB — RESPIRATORY PANEL BY RT PCR (FLU A&B, COVID)
Influenza A by PCR: NEGATIVE
Influenza B by PCR: NEGATIVE
SARS Coronavirus 2 by RT PCR: POSITIVE — AB

## 2019-04-03 SURGERY — IRRIGATION AND DEBRIDEMENT EXTREMITY
Anesthesia: General | Site: Hand | Laterality: Left

## 2019-04-03 MED ORDER — PROPOFOL 10 MG/ML IV BOLUS
INTRAVENOUS | Status: AC
  Filled 2019-04-03: qty 20

## 2019-04-03 MED ORDER — PHENYLEPHRINE 40 MCG/ML (10ML) SYRINGE FOR IV PUSH (FOR BLOOD PRESSURE SUPPORT)
PREFILLED_SYRINGE | INTRAVENOUS | Status: AC
  Filled 2019-04-03: qty 10

## 2019-04-03 MED ORDER — METHOCARBAMOL 1000 MG/10ML IJ SOLN
500.0000 mg | Freq: Four times a day (QID) | INTRAVENOUS | Status: DC | PRN
  Filled 2019-04-03: qty 5

## 2019-04-03 MED ORDER — FENTANYL CITRATE (PF) 250 MCG/5ML IJ SOLN
INTRAMUSCULAR | Status: DC | PRN
  Administered 2019-04-03: 150 ug via INTRAVENOUS
  Administered 2019-04-03: 100 ug via INTRAVENOUS

## 2019-04-03 MED ORDER — SODIUM CHLORIDE 0.9 % IR SOLN
Status: DC | PRN
  Administered 2019-04-03 (×3): 3000 mL

## 2019-04-03 MED ORDER — ROCURONIUM BROMIDE 10 MG/ML (PF) SYRINGE
PREFILLED_SYRINGE | INTRAVENOUS | Status: AC
  Filled 2019-04-03: qty 10

## 2019-04-03 MED ORDER — MIDAZOLAM HCL 5 MG/5ML IJ SOLN
INTRAMUSCULAR | Status: DC | PRN
  Administered 2019-04-03: 2 mg via INTRAVENOUS

## 2019-04-03 MED ORDER — SUCCINYLCHOLINE CHLORIDE 200 MG/10ML IV SOSY
PREFILLED_SYRINGE | INTRAVENOUS | Status: DC | PRN
  Administered 2019-04-03: 140 mg via INTRAVENOUS

## 2019-04-03 MED ORDER — ONDANSETRON HCL 4 MG/2ML IJ SOLN
4.0000 mg | Freq: Four times a day (QID) | INTRAMUSCULAR | Status: DC | PRN
  Administered 2019-04-04: 4 mg via INTRAVENOUS
  Filled 2019-04-03: qty 2

## 2019-04-03 MED ORDER — HYDROMORPHONE HCL 1 MG/ML IJ SOLN
1.0000 mg | Freq: Once | INTRAMUSCULAR | Status: AC
  Administered 2019-04-03: 11:00:00 1 mg via INTRAVENOUS
  Filled 2019-04-03: qty 1

## 2019-04-03 MED ORDER — HYDROMORPHONE HCL 1 MG/ML IJ SOLN
0.2500 mg | INTRAMUSCULAR | Status: DC | PRN
  Administered 2019-04-03 (×2): 0.5 mg via INTRAVENOUS

## 2019-04-03 MED ORDER — DEXAMETHASONE SODIUM PHOSPHATE 10 MG/ML IJ SOLN
INTRAMUSCULAR | Status: AC
  Filled 2019-04-03: qty 2

## 2019-04-03 MED ORDER — EPHEDRINE SULFATE-NACL 50-0.9 MG/10ML-% IV SOSY
PREFILLED_SYRINGE | INTRAVENOUS | Status: DC | PRN
  Administered 2019-04-03: 5 mg via INTRAVENOUS
  Administered 2019-04-03: 10 mg via INTRAVENOUS

## 2019-04-03 MED ORDER — ACETAMINOPHEN 325 MG PO TABS: 325.0000 mg | ORAL_TABLET | Freq: Four times a day (QID) | ORAL | Status: DC | PRN

## 2019-04-03 MED ORDER — TRAZODONE HCL 50 MG PO TABS
50.0000 mg | ORAL_TABLET | Freq: Every day | ORAL | Status: DC
  Administered 2019-04-03: 22:00:00 50 mg via ORAL
  Filled 2019-04-03: qty 1

## 2019-04-03 MED ORDER — GABAPENTIN 600 MG PO TABS
300.0000 mg | ORAL_TABLET | Freq: Three times a day (TID) | ORAL | Status: DC
  Administered 2019-04-03 – 2019-04-04 (×2): 300 mg via ORAL
  Filled 2019-04-03 (×2): qty 1

## 2019-04-03 MED ORDER — HYDROMORPHONE HCL 1 MG/ML IJ SOLN
INTRAMUSCULAR | Status: DC | PRN
  Administered 2019-04-03: .5 mg via INTRAVENOUS

## 2019-04-03 MED ORDER — ONDANSETRON HCL 4 MG/2ML IJ SOLN
INTRAMUSCULAR | Status: AC
  Filled 2019-04-03: qty 2

## 2019-04-03 MED ORDER — HYDROMORPHONE HCL 1 MG/ML IJ SOLN
1.0000 mg | Freq: Once | INTRAMUSCULAR | Status: AC
  Administered 2019-04-03: 14:00:00 1 mg via INTRAVENOUS
  Filled 2019-04-03: qty 1

## 2019-04-03 MED ORDER — BUPIVACAINE HCL (PF) 0.25 % IJ SOLN
INTRAMUSCULAR | Status: DC | PRN
  Administered 2019-04-03: 20 mL

## 2019-04-03 MED ORDER — MEPERIDINE HCL 25 MG/ML IJ SOLN: 6.2500 mg | INTRAMUSCULAR | Status: DC | PRN

## 2019-04-03 MED ORDER — CEFAZOLIN SODIUM-DEXTROSE 2-4 GM/100ML-% IV SOLN
2.0000 g | Freq: Three times a day (TID) | INTRAVENOUS | Status: DC
  Administered 2019-04-03 – 2019-04-04 (×2): 2 g via INTRAVENOUS
  Filled 2019-04-03 (×3): qty 100

## 2019-04-03 MED ORDER — ONDANSETRON HCL 4 MG PO TABS: 4.0000 mg | ORAL_TABLET | Freq: Four times a day (QID) | ORAL | Status: DC | PRN

## 2019-04-03 MED ORDER — HYDROCODONE-ACETAMINOPHEN 5-325 MG PO TABS: 1.0000 | ORAL_TABLET | ORAL | Status: DC | PRN

## 2019-04-03 MED ORDER — BACLOFEN 5 MG HALF TABLET
5.0000 mg | ORAL_TABLET | Freq: Every day | ORAL | Status: DC
  Administered 2019-04-03: 22:00:00 5 mg via ORAL
  Filled 2019-04-03: qty 1

## 2019-04-03 MED ORDER — PROPOFOL 10 MG/ML IV BOLUS
INTRAVENOUS | Status: DC | PRN
  Administered 2019-04-03: 160 mg via INTRAVENOUS

## 2019-04-03 MED ORDER — HYDROCODONE-ACETAMINOPHEN 7.5-325 MG PO TABS
1.0000 | ORAL_TABLET | ORAL | Status: DC | PRN
  Administered 2019-04-03 – 2019-04-04 (×2): 2 via ORAL
  Filled 2019-04-03 (×2): qty 2

## 2019-04-03 MED ORDER — ACETAMINOPHEN 160 MG/5ML PO SOLN: 325.0000 mg | Freq: Once | ORAL | Status: DC | PRN

## 2019-04-03 MED ORDER — PHENYLEPHRINE 40 MCG/ML (10ML) SYRINGE FOR IV PUSH (FOR BLOOD PRESSURE SUPPORT)
PREFILLED_SYRINGE | INTRAVENOUS | Status: DC | PRN
  Administered 2019-04-03: 80 ug via INTRAVENOUS
  Administered 2019-04-03 (×2): 120 ug via INTRAVENOUS
  Administered 2019-04-03: 80 ug via INTRAVENOUS

## 2019-04-03 MED ORDER — BUPIVACAINE HCL (PF) 0.25 % IJ SOLN
INTRAMUSCULAR | Status: AC
  Filled 2019-04-03: qty 30

## 2019-04-03 MED ORDER — ACETAMINOPHEN 10 MG/ML IV SOLN: 1000.0000 mg | Freq: Once | INTRAVENOUS | Status: DC | PRN

## 2019-04-03 MED ORDER — HYDROXYZINE HCL 25 MG PO TABS: 25.0000 mg | ORAL_TABLET | Freq: Three times a day (TID) | ORAL | Status: DC | PRN

## 2019-04-03 MED ORDER — ONDANSETRON HCL 4 MG/2ML IJ SOLN
INTRAMUSCULAR | Status: DC | PRN
  Administered 2019-04-03: 4 mg via INTRAVENOUS

## 2019-04-03 MED ORDER — ACETAMINOPHEN 325 MG PO TABS: 325.0000 mg | ORAL_TABLET | Freq: Once | ORAL | Status: DC | PRN

## 2019-04-03 MED ORDER — LACTATED RINGERS IV SOLN: INTRAVENOUS | Status: DC

## 2019-04-03 MED ORDER — PROMETHAZINE HCL 25 MG/ML IJ SOLN: 6.2500 mg | INTRAMUSCULAR | Status: DC | PRN

## 2019-04-03 MED ORDER — DOCUSATE SODIUM 100 MG PO CAPS
100.0000 mg | ORAL_CAPSULE | Freq: Two times a day (BID) | ORAL | Status: DC
  Administered 2019-04-03 – 2019-04-04 (×2): 100 mg via ORAL
  Filled 2019-04-03 (×2): qty 1

## 2019-04-03 MED ORDER — DEXAMETHASONE SODIUM PHOSPHATE 10 MG/ML IJ SOLN
INTRAMUSCULAR | Status: DC | PRN
  Administered 2019-04-03: 10 mg via INTRAVENOUS

## 2019-04-03 MED ORDER — 0.9 % SODIUM CHLORIDE (POUR BTL) OPTIME
TOPICAL | Status: DC | PRN
  Administered 2019-04-03: 14:00:00 1000 mL

## 2019-04-03 MED ORDER — MIDAZOLAM HCL 2 MG/2ML IJ SOLN
INTRAMUSCULAR | Status: AC
  Filled 2019-04-03: qty 2

## 2019-04-03 MED ORDER — POVIDONE-IODINE 10 % EX SWAB: 2.0000 "application " | Freq: Once | CUTANEOUS | Status: DC

## 2019-04-03 MED ORDER — ALBUMIN HUMAN 5 % IV SOLN: INTRAVENOUS | Status: DC | PRN

## 2019-04-03 MED ORDER — METHOCARBAMOL 500 MG PO TABS: 500.0000 mg | ORAL_TABLET | Freq: Four times a day (QID) | ORAL | Status: DC | PRN

## 2019-04-03 MED ORDER — CEFAZOLIN SODIUM-DEXTROSE 2-4 GM/100ML-% IV SOLN
2.0000 g | INTRAVENOUS | Status: AC
  Administered 2019-04-03: 2 g via INTRAVENOUS

## 2019-04-03 MED ORDER — LIDOCAINE 2% (20 MG/ML) 5 ML SYRINGE
INTRAMUSCULAR | Status: AC
  Filled 2019-04-03: qty 5

## 2019-04-03 MED ORDER — HYDROMORPHONE HCL 1 MG/ML IJ SOLN
INTRAMUSCULAR | Status: AC
  Filled 2019-04-03: qty 0.5

## 2019-04-03 MED ORDER — FENTANYL CITRATE (PF) 250 MCG/5ML IJ SOLN
INTRAMUSCULAR | Status: AC
  Filled 2019-04-03: qty 5

## 2019-04-03 MED ORDER — CHLORHEXIDINE GLUCONATE 4 % EX LIQD
60.0000 mL | Freq: Once | CUTANEOUS | Status: DC
  Filled 2019-04-03: qty 60

## 2019-04-03 MED ORDER — HYDROMORPHONE HCL 1 MG/ML IJ SOLN
INTRAMUSCULAR | Status: AC
  Filled 2019-04-03: qty 1

## 2019-04-03 MED ORDER — PANTOPRAZOLE SODIUM 40 MG PO TBEC
40.0000 mg | DELAYED_RELEASE_TABLET | Freq: Every day | ORAL | Status: DC
  Administered 2019-04-04: 40 mg via ORAL
  Filled 2019-04-03: qty 1

## 2019-04-03 MED ORDER — SUCCINYLCHOLINE CHLORIDE 200 MG/10ML IV SOSY
PREFILLED_SYRINGE | INTRAVENOUS | Status: AC
  Filled 2019-04-03: qty 10

## 2019-04-03 MED ORDER — DULOXETINE HCL 60 MG PO CPEP
120.0000 mg | ORAL_CAPSULE | Freq: Every day | ORAL | Status: DC
  Administered 2019-04-04: 120 mg via ORAL
  Filled 2019-04-03: qty 2

## 2019-04-03 MED ORDER — MORPHINE SULFATE (PF) 2 MG/ML IV SOLN
0.5000 mg | INTRAVENOUS | Status: DC | PRN
  Administered 2019-04-03 – 2019-04-04 (×2): 1 mg via INTRAVENOUS
  Filled 2019-04-03 (×2): qty 1

## 2019-04-03 MED ORDER — LIDOCAINE 2% (20 MG/ML) 5 ML SYRINGE
INTRAMUSCULAR | Status: DC | PRN
  Administered 2019-04-03: 60 mg via INTRAVENOUS
  Administered 2019-04-03: 40 mg via INTRAVENOUS

## 2019-04-03 MED ORDER — ALPRAZOLAM 0.5 MG PO TABS: 0.5000 mg | ORAL_TABLET | Freq: Four times a day (QID) | ORAL | Status: DC | PRN

## 2019-04-03 MED ORDER — DEXMEDETOMIDINE HCL 200 MCG/2ML IV SOLN
INTRAVENOUS | Status: DC | PRN
  Administered 2019-04-03: 20 ug via INTRAVENOUS

## 2019-04-03 MED ORDER — EPHEDRINE 5 MG/ML INJ
INTRAVENOUS | Status: AC
  Filled 2019-04-03: qty 10

## 2019-04-03 MED ORDER — SODIUM CHLORIDE 0.9 % IV SOLN: INTRAVENOUS | Status: DC | PRN

## 2019-04-03 SURGICAL SUPPLY — 57 items
BNDG ELASTIC 3X5.8 VLCR STR LF (GAUZE/BANDAGES/DRESSINGS) ×3 IMPLANT
BNDG ELASTIC 4X5.8 VLCR STR LF (GAUZE/BANDAGES/DRESSINGS) ×3 IMPLANT
BNDG ESMARK 4X9 LF (GAUZE/BANDAGES/DRESSINGS) IMPLANT
BNDG GAUZE ELAST 4 BULKY (GAUZE/BANDAGES/DRESSINGS) ×3 IMPLANT
CAP PIN PROTECTOR ORTHO WHT (CAP) ×3 IMPLANT
CHLORAPREP W/TINT 26 (MISCELLANEOUS) ×3 IMPLANT
CORD BIPOLAR FORCEPS 12FT (ELECTRODE) ×3 IMPLANT
COVER SURGICAL LIGHT HANDLE (MISCELLANEOUS) ×3 IMPLANT
COVER WAND RF STERILE (DRAPES) ×3 IMPLANT
CUFF TOURN SGL QUICK 18X4 (TOURNIQUET CUFF) ×3 IMPLANT
CUFF TOURN SGL QUICK 24 (TOURNIQUET CUFF)
CUFF TRNQT CYL 24X4X16.5-23 (TOURNIQUET CUFF) IMPLANT
DRAPE U-SHAPE 47X51 STRL (DRAPES) ×3 IMPLANT
GAUZE SPONGE 4X4 12PLY STRL (GAUZE/BANDAGES/DRESSINGS) ×3 IMPLANT
GAUZE SPONGE 4X4 12PLY STRL LF (GAUZE/BANDAGES/DRESSINGS) ×3 IMPLANT
GAUZE XEROFORM 5X9 LF (GAUZE/BANDAGES/DRESSINGS) ×3 IMPLANT
GLOVE BIOGEL PI IND STRL 8 (GLOVE) ×2 IMPLANT
GLOVE BIOGEL PI INDICATOR 8 (GLOVE) ×1
GLOVE SURG PROTEXIS BL SZ6.5 (GLOVE) ×9 IMPLANT
GLOVE SURG SYN 7.5  E (GLOVE) ×1
GLOVE SURG SYN 7.5 E (GLOVE) ×2 IMPLANT
GOWN STRL REUS W/ TWL LRG LVL3 (GOWN DISPOSABLE) ×2 IMPLANT
GOWN STRL REUS W/TWL LRG LVL3 (GOWN DISPOSABLE) ×1
K-WIRE .045X4 (WIRE) ×3 IMPLANT
KIT BASIN OR (CUSTOM PROCEDURE TRAY) ×3 IMPLANT
KIT INNATE INSTRUMENT FOR 3.6 (INSTRUMENTS) ×3 IMPLANT
KIT INNATE INSTRUMENT FOR 4.5 (INSTRUMENTS) ×3 IMPLANT
KIT TURNOVER KIT B (KITS) ×3 IMPLANT
MANIFOLD NEPTUNE II (INSTRUMENTS) ×3 IMPLANT
NAIL IM THRD  INNATE 3.6X50 (Nail) ×1 IMPLANT
NAIL IM THRD INNATE 3.6X50 (Nail) ×2 IMPLANT
NAIL IM THRD INNATE 4.5X65 (Nail) ×3 IMPLANT
NEEDLE HYPO 25GX1X1/2 BEV (NEEDLE) ×3 IMPLANT
NS IRRIG 1000ML POUR BTL (IV SOLUTION) ×3 IMPLANT
PACK ORTHO EXTREMITY (CUSTOM PROCEDURE TRAY) ×3 IMPLANT
PAD ARMBOARD 7.5X6 YLW CONV (MISCELLANEOUS) ×3 IMPLANT
PAD CAST 3X4 CTTN HI CHSV (CAST SUPPLIES) ×2 IMPLANT
PAD CAST 4YDX4 CTTN HI CHSV (CAST SUPPLIES) ×2 IMPLANT
PADDING CAST COTTON 3X4 STRL (CAST SUPPLIES) ×1
PADDING CAST COTTON 4X4 STRL (CAST SUPPLIES) ×1
PENCIL SMOKE EVACUATOR (MISCELLANEOUS) ×3 IMPLANT
SET CYSTO W/LG BORE CLAMP LF (SET/KITS/TRAYS/PACK) ×3 IMPLANT
SPLINT FIBERGLASS 3X12 (CAST SUPPLIES) ×3 IMPLANT
SPONGE LAP 4X18 RFD (DISPOSABLE) ×6 IMPLANT
SUT ETHIBOND 3-0 V-5 (SUTURE) ×9 IMPLANT
SUT PROLENE 4 0 PS 2 18 (SUTURE) ×9 IMPLANT
SUT SUPRAMID 3-0 (SUTURE) ×6 IMPLANT
SWAB CULTURE ESWAB REG 1ML (MISCELLANEOUS) IMPLANT
SYR CONTROL 10ML LL (SYRINGE) ×3 IMPLANT
TOWEL GREEN STERILE (TOWEL DISPOSABLE) ×3 IMPLANT
TOWEL GREEN STERILE FF (TOWEL DISPOSABLE) ×3 IMPLANT
TUBE CONNECTING 12X1/4 (SUCTIONS) ×3 IMPLANT
TUBING TUR DISP (UROLOGICAL SUPPLIES) IMPLANT
UNDERPAD 30X30 (UNDERPADS AND DIAPERS) ×3 IMPLANT
UNDERPAD 30X36 HEAVY ABSORB (UNDERPADS AND DIAPERS) ×3 IMPLANT
WATER STERILE IRR 1000ML POUR (IV SOLUTION) ×3 IMPLANT
YANKAUER SUCT BULB TIP NO VENT (SUCTIONS) ×3 IMPLANT

## 2019-04-03 NOTE — ED Triage Notes (Signed)
Pt BIB [facility] w/ Left hand injury. Pt was using a circular saw and has laceration exposing bone across the top of the left hand and near pinkie amputation, per EMS. Pt received Ancef and 200 mg Fentanyl. Pt hx of spinal stimulator. Pt VS stable, pt reporting 10/10 pain.

## 2019-04-03 NOTE — Progress Notes (Addendum)
Patient admitted to 5W, Rm 8 at change of shift.  Patient AOX4. Patient 100% on 2L/[place].  Pain 5/10 L hand.  Skin check done w/ Rose, Skin intact.  Patient assessed and admission vital signs done.  2014  Patient c/o 8/10 L hand pain, given 2 norco tabs prn.  Patient has decrease in feeling to L index finger. Will monitor.  2344  Patient c/o 9/10 L hand pain, given prn IV morphine.  0300  Sensation returning to hand, especially L index finger.    7614  Patient c/o 9/10 L hand pain, given prn IV morphine.

## 2019-04-03 NOTE — Progress Notes (Addendum)
Patient arrive to the unit in stable conditions at shift change, report given to the night shift nurse.

## 2019-04-03 NOTE — Anesthesia Procedure Notes (Signed)
Procedure Name: Intubation Date/Time: 04/03/2019 3:21 PM Performed by: Adonis Housekeeper, CRNA Pre-anesthesia Checklist: Patient identified, Emergency Drugs available, Suction available and Patient being monitored Patient Re-evaluated:Patient Re-evaluated prior to induction Oxygen Delivery Method: Circle system utilized Preoxygenation: Pre-oxygenation with 100% oxygen Induction Type: IV induction Ventilation: Mask ventilation without difficulty Laryngoscope Size: Glidescope and 4 Grade View: Grade I Tube type: Oral Tube size: 7.0 mm Number of attempts: 1 Airway Equipment and Method: Stylet and Video-laryngoscopy Placement Confirmation: ETT inserted through vocal cords under direct vision,  positive ETCO2 and breath sounds checked- equal and bilateral Secured at: 20 cm Tube secured with: Tape Dental Injury: Teeth and Oropharynx as per pre-operative assessment  Comments: Elective glidescope covid +

## 2019-04-03 NOTE — Consult Note (Signed)
Reason for Consult:Left hand injury Referring Physician: W Valon [name] is an 39 y.o. male.  HPI: [name] [name] was working at home with a radial arm saw and cut his left hand. He came to the ED and hand surgery was consulted. He is RHD.  No past medical history on file.  Past Surgical History:  Procedure Laterality Date  . APPENDECTOMY    . sinusectomy    . VASCULAR SURGERY      No family history on file.  Social History:  reports that he has never smoked. He has never used smokeless tobacco. He reports that he does not drink alcohol or use drugs.  Allergies:  Allergies  Allergen Reactions  . Hydrocodone Itching    Pt reports he can tolerate when taken with benadryl   . Oxycodone Itching    Pt reports he can tolerate when taken with benadryl   . Cinnamon Nausea And Vomiting    migraines    Medications: I have reviewed the patient's current medications.  No results found for this or any previous visit (from the past 48 hour(s)).  No results found.  Review of Systems  HENT: Negative for ear discharge, ear pain, hearing loss and tinnitus.   Eyes: Negative for photophobia and pain.  Respiratory: Negative for cough and shortness of breath.   Cardiovascular: Negative for chest pain.  Gastrointestinal: Negative for abdominal pain, nausea and vomiting.  Genitourinary: Negative for dysuria, flank pain, frequency and urgency.  Musculoskeletal: Positive for arthralgias (Left hand). Negative for back pain, myalgias and neck pain.  Neurological: Negative for dizziness and headaches.  Hematological: Does not bruise/bleed easily.  Psychiatric/Behavioral: The patient is not nervous/anxious.    Blood pressure (!) 143/80, pulse 98, temperature 98.2 F (36.8 C), temperature source Oral, resp. rate (!) 22, height 5\' 8"  (1.727 m), weight 74.8 kg, SpO2 95 %. Physical Exam  Constitutional: He appears well-developed and well-nourished. No distress.  HENT:  Head: Normocephalic and  atraumatic.  Eyes: Conjunctivae are normal. Right eye exhibits no discharge. Left eye exhibits no discharge. No scleral icterus.  Cardiovascular: Normal rate and regular rhythm.  Respiratory: Effort normal. No respiratory distress.  Musculoskeletal:     Cervical back: Normal range of motion.     Comments: Left shoulder, elbow, wrist, digits- Large complex laceration over dorsum of hand, little finger near complete amputation distal to MCP, exposed and severed tendons noted for index and ring finger, severe TTP, sensation intact to fingers 1-4, no instability, no blocks to motion  Sens  Ax/R/M/U intact  Mot   Ax/ R/ PIN/ M/ AIN/ U intact  Rad 2+  Neurological: He is alert.  Skin: Skin is warm and dry. He is not diaphoretic.  Psychiatric: He has a normal mood and affect. His behavior is normal.    Assessment/Plan: Left hand injury -- To OR for I&D, possible ORIF depending on x-rays. Depression Chest pains Chronic back pain    , PA-C Orthopedic Surgery [phone] 04/03/2019, 12:08 PM

## 2019-04-03 NOTE — Anesthesia Preprocedure Evaluation (Addendum)
Anesthesia Evaluation  Patient identified by MRN, date of birth, ID band Patient awake    Reviewed: Allergy & Precautions, NPO status , Patient's Chart, lab work & pertinent test results  Airway Mallampati: II  TM Distance: >3 FB Neck ROM: Full    Dental  (+) Teeth Intact, Dental Advisory Given   Pulmonary neg pulmonary ROS,    breath sounds clear to auscultation       Cardiovascular negative cardio ROS   Rhythm:Regular Rate:Normal     Neuro/Psych negative neurological ROS  negative psych ROS   GI/Hepatic Neg liver ROS, GERD  Medicated,  Endo/Other  negative endocrine ROS  Renal/GU negative Renal ROS     Musculoskeletal negative musculoskeletal ROS (+)   Abdominal Normal abdominal exam  (+)   Peds  Hematology negative hematology ROS (+)   Anesthesia Other Findings   Reproductive/Obstetrics                            Anesthesia Physical Anesthesia Plan  ASA: II and emergent  Anesthesia Plan: General   Post-op Pain Management:    Induction: Intravenous, Rapid sequence and Cricoid pressure planned  PONV Risk Score and Plan: 3 and Ondansetron, Dexamethasone and Midazolam  Airway Management Planned: Oral ETT  Additional Equipment: None  Intra-op Plan:   Post-operative Plan: Extubation in OR  Informed Consent: I have reviewed the patients History and Physical, chart, labs and discussed the procedure including the risks, benefits and alternatives for the proposed anesthesia with the patient or authorized representative who has indicated his/her understanding and acceptance.     Dental advisory given  Plan Discussed with: CRNA  Anesthesia Plan Comments: (Covid-19 Nucleic Acid Test Results Lab Results      Component                Value               Date                      [id number]              POSITIVE (A)        04/03/2019           )      Anesthesia Quick  Evaluation

## 2019-04-03 NOTE — OR Nursing (Signed)
Patient's ring was removed prior to the surgery.  It was cleaned with peroxide and placed in a speciment bag with a patient label attached.  The ring in the bag was placed in the patient's belongings bag next to his cell phone as can be attested by Merideth Abbey, CRNA.

## 2019-04-03 NOTE — Op Note (Signed)
PREOPERATIVE DIAGNOSIS: Left hand saw injury  POSTOPERATIVE DIAGNOSIS: Same  ATTENDING PHYSICIAN: Maudry Mayhew. Jeannie Fend, III, MD who was present and scrubbed for the entire case   ASSISTANT SURGEON: None.   ANESTHESIA: General  SURGICAL PROCEDURES:  1.  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  Left index finger metacarpal ORIF 3.  Left middle finger metacarpal ORIF 4.  EDC repair to the left index, middle and ring fingers 5.  Relocation of traumatic left ring finger MP joint dislocation with repair of radial and ulnar collateral ligaments at the MP joint 6.  Irrigation and debridement of open fractures including skin, subcutaneous tissue and bone 7.  Left ring finger FDP repair in zone 2  SURGICAL INDICATIONS: Patient is a 39 year old male who earlier today sustained a severe injury to the left hand from a radial arm saw at work.  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  I had a long discussion with him regarding his hand did recommend proceeding forward with debridement of his hand and repair of structures as indicated.  FINDING: See description of procedure  DESCRIPTION OF PROCEDURE: The patient was identified in the ER where the risk benefits and alternatives of the procedure were discussed with the patient.  These include but are not limited to infection, bleeding, damage to surrounding structures including blood vessels and nerves, pain, stiffness, malunion, nonunion, implant failure, tendon retear and need for additional procedures.  Informed consent was obtained at that time the patient's left hand was marked with a surgical marking pen.  The patient was  Covid positive so he was brought directly from the ER to the operating room.  Upon entry into the operating room a timeout was performed identifying the correct patient operative site.  He was induced under general anesthesia and placed with his left hand outstretched on a hand table.  A tourniquet is placed on the upper arm and the arm was then prepped and draped in usual sterile fashion using Betadine scrub and paint.  The limb was exsanguinated and the tourniquet was inflated.  There was a severe injury to the left hand with a large laceration along its dorsal aspect.  The small finger was nearly completely amputated with just a small skin bridge radially that was keeping the finger attached.  This was transected completing the amputation through the midportion of the proximal phalanx.  The remaining portion of the proximal phalanx was then sharply dissected out and amputated through the MP joint.  The proximal phalanx bone was saved for later autograft usage.  At this point the hand was thoroughly debrided and irrigated.  Irrigation was done utilizing cystoscopy tubing and 6 L of normal saline.  Debridement was performed both sharply and bluntly with scissors as well as rondure.  Debridement included skin, subcutaneous tissue and bone.  Overall the wound was rather clean without significant amounts of gross contamination.  Because of this decision was made to proceed forward with definitive fixation of his metacarpal fractures.  The index finger was the first treated.  A double-ended K wire was placed within the fractured metacarpal shaft and advanced antegrade through the MP joint.  The  fracture was then reduced and the K wire was advanced retrograde into the base of the index metacarpal.  This was found to be in near anatomic alignment despite severe comminution around the fracture site.  Decision was made to place an intramedullary screw at this point.  A small incision was made at the MP joint where  the K wire exited.  Dissection was performed through the subcutaneous tissue and the extensor tendon was mobilized.  The guidepin was then measured and the appropriately sized drill was advanced over top of the K wire through the metacarpal head and down the medullary shaft.  This was for a 4.5 mm Innate screw.  The screw was then advanced over top of the K wire and had excellent fixation in near-anatomic alignment and appropriate rotation through the fracture.  Attention was then turned to the middle finger.  The same technique was utilized with fixation of the index finger.  K wire was advanced through the metacarpal head antegrade though the laceration had exposure of the metacarpal head so a second incision was not needed.  Once the pin was advanced to the metacarpal head the fracture was reduced and advanced retrograde up the medullary shaft to the base of the metacarpal.  The appropriate size screw was measured and a 3.6 mm Innate screw was placed with near-anatomic alignment of the fracture.  There was severe comminution around the fracture site so some autograft from the fracture as well as from the saved portion of the small finger proximal phalanx was packed around the fracture site.  At this point the attention was turned to the ring finger.  There was traumatic dislocation with complete transection of the joint capsule as well as collateral ligaments at the MP joint.  The laceration also continued through volarly and had cut the FDP tendon.  The MP joint was then reduced and cross pinned for stability.  Interrupted 3-0 Ethibond sutures were then used to approximate what was left of the radial and ulnar collateral ligaments as well as the joint capsule to provide some stability.  Cross pinning was done with two 0.045 K wires.  Once they were confirmed to be in appropriate position and the joint was reduced, the pins were bent and cut external to his skin.  The EDC tendons to the index, middle and  ring fingers were all identified both proximally and distally.  They were brought to their respective fingers and reapproximated and repaired using interrupted 3-0 Ethibond sutures in figure-of-eight fashion.  Multiple sutures were placed in each tendon repair site.  The wound at this point was then copiously irrigated once again.  Skin was closed with interrupted 4-0 Prolene sutures.  We then worked palmarly extending the traumatic laceration which was around the small finger into a Bruner incision along the fourth ray.  Blunt dissection was carried down through the subcutaneous tissues and skin flaps were elevated.  The digital neurovascular bundles were visualized and protected.  The A1 pulley was released and deep to this there was a complete laceration of the FDP tendon and a incomplete laceration to the FDS tendon.  The 2 ends of the FDP tendon were then reapproximated while protecting the FDS tendon.  Utilizing a 3 oh looped Supramid, a M. Tang 6 strand repair was performed.  This reestablished the cascade to the ring finger which had been lacking prior to the repair.  This point the remaining wounds were copiously irrigated with normal saline once again.  The skin  was closed with interrupted 4-0 Prolene sutures.  Excess skin was excised around the amputated small finger site.  Xeroform, 4 x 4's, Kerlix, web roll and a well-padded volar slab splint and comfortable position was then placed.  The tourniquet was released and the patient had return of brisk capillary refill to all of his digits.  He was awoken from his anesthesia and extubated in the operating room without any complications.  He was recovered in the operating room secondary to his Covid positive status.  He tolerated procedure well and there were no complications.  RADIOGRAPHIC INTERPRETATION: PA, lateral and oblique images of the left hand were obtained intraoperative under fluoroscopic images.  These show amputation of the small finger  through the MP joint.  There has been 2 K wires crossing the ring finger MP joint without evidence of complication.  2 intramedullary screws are within the middle and index finger metacarpals.  ESTIMATED BLOOD LOSS: 20 mL  TOURNIQUET TIME: 2 hours and 10 minutes.  SPECIMENS: None  POSTOPERATIVE PLAN: The patient will be admitted for observation overnight.  I will plan to see him back in clinic in approximately 7 to 10 days for removal of his surgical dressings and referral down to occupational therapy.  He will then be placed into a removable splint to help protect his fractures as well as tendon repairs.  IMPLANTS: 0.045 K wires x2, 4.5 mm Innate screw x1, 3.6 mm Innate screw x1

## 2019-04-03 NOTE — ED Provider Notes (Signed)
[facility] EMERGENCY DEPARTMENT Provider Note   CSN: [id number] Arrival date & time: 04/03/19  1101     History Chief Complaint  Patient presents with  . Laceration    L hand    [name] [name] is a 39 y.o. male who presents to ED for nondominant L hand laceration that occurred approximately 1hr prior to arrival. He was at home using a saw when he lacerated his hand.  He reports decreased sensation to his left fifth digit.  Pain somewhat controlled with fentanyl given by EMS.  States that his tetanus is up-to-date.  Last meal was approximately 6 to 7 hours ago.  Denies recent COVID symptoms or anticoagulant use.  HPI     History reviewed. No pertinent past medical history.  There are no problems to display for this patient.   Past Surgical History:  Procedure Laterality Date  . APPENDECTOMY    . sinusectomy    . VASCULAR SURGERY         History reviewed. No pertinent family history.  Social History   Tobacco Use  . Smoking status: Never Smoker  . Smokeless tobacco: Never Used  Substance Use Topics  . Alcohol use: No  . Drug use: No    Home Medications Prior to Admission medications   Medication Sig Start Date End Date Taking? Authorizing Provider  baclofen (LIORESAL) 10 MG tablet Take 5 mg by mouth at bedtime. 01/30/19  Yes [provider]  diphenhydrAMINE (BENADRYL) 25 MG tablet Take 25 mg by mouth every 6 (six) hours as needed for itching.   Yes [provider]  docusate sodium (COLACE) 100 MG capsule Take 100 mg by mouth daily as needed for mild constipation.   Yes [provider]  DULoxetine (CYMBALTA) 60 MG capsule Take 120 mg by mouth daily. 03/15/19  Yes [provider]  eletriptan (RELPAX) 40 MG tablet Take 40 mg by mouth every 2 (two) hours as needed for migraine or headache. May repeat in 2 hours if headache persists or recurs.   Yes [provider]  gabapentin (NEURONTIN) 600 MG tablet Take  300 mg by mouth 3 (three) times daily. 03/03/19  Yes [provider]  omeprazole (PRILOSEC) 20 MG capsule Take 40 mg by mouth daily.   Yes [provider]  oxymetazoline (AFRIN) 0.05 % nasal spray Place 1 spray into both nostrils daily.    Yes [provider]  polyethylene glycol (MIRALAX / GLYCOLAX) packet Take 17 g by mouth every other day.    Yes [provider]  sodium chloride (OCEAN) 0.65 % SOLN nasal spray Place 1 spray into both nostrils daily.   Yes [provider]  SUMAtriptan Succinate (ONZETRA XSAIL) 11 MG/NOSEPC EXHP Place 1 spray into both nostrils daily as needed (migraine).   Yes [provider]  traMADol (ULTRAM) 50 MG tablet Take 50 mg by mouth 3 (three) times daily.  03/19/17  Yes [provider]  traZODone (DESYREL) 50 MG tablet Take 50 mg by mouth at bedtime. 02/13/19  Yes [provider]  Triamcinolone Acetonide (NASACORT ALLERGY 24HR NA) Place 1 spray into both nostrils daily.   Yes [provider]  VITAMIN D PO Take 1 tablet by mouth daily.   Yes [provider]  butalbital-acetaminophen-caffeine (FIORICET, ESGIC) 50-325-40 MG tablet Take 1-2 tablets by mouth every 6 (six) hours as needed for headache. Patient not taking: Reported on 04/03/2019 04/25/17   Robinson, [place] N, PA-C  HYDROcodone-acetaminophen (NORCO/VICODIN) [phone]  MG tablet Take 1-2 tablets by mouth every 6 (six) hours as needed for moderate pain. Patient not taking: Reported on 04/25/2017 08/13/16   Cathren Laine, MD  oxyCODONE-acetaminophen (PERCOCET/ROXICET) 5-325 MG tablet Take 1 tablet by mouth every 6 (six) hours as needed for severe pain. 03/19/17   [provider]  predniSONE (DELTASONE) 20 MG tablet 3 po once a day for 2 days, then 2 po once a day for 3 days, then 1 po once a day for 3 days Patient not taking: Reported on 04/25/2017 08/13/16   Cathren Laine, MD    Allergies    Hydrocodone, Oxycodone, and  Cinnamon  Review of Systems   Review of Systems  Constitutional: Negative for appetite change, chills and fever.  HENT: Negative for ear pain, rhinorrhea, sneezing and sore throat.   Eyes: Negative for photophobia and visual disturbance.  Respiratory: Negative for cough, chest tightness, shortness of breath and wheezing.   Cardiovascular: Negative for chest pain and palpitations.  Gastrointestinal: Negative for abdominal pain, blood in stool, constipation, diarrhea, nausea and vomiting.  Genitourinary: Negative for dysuria, hematuria and urgency.  Musculoskeletal: Negative for myalgias.  Skin: Positive for wound. Negative for rash.  Neurological: Negative for dizziness, weakness and light-headedness.    Physical Exam Updated Vital Signs BP 129/89   Pulse 98   Temp 98.2 F (36.8 C) (Oral)   Resp 20   Ht 5\' 8"  (1.727 m)   Wt 74.8 kg   SpO2 96%   BMI 25.09 kg/m   Physical Exam Vitals and nursing note reviewed.  Constitutional:      General: He is not in acute distress.    Appearance: He is well-developed.  HENT:     Head: Normocephalic and atraumatic.     Nose: Nose normal.  Eyes:     General: No scleral icterus.       Left eye: No discharge.     Conjunctiva/sclera: Conjunctivae normal.  Cardiovascular:     Rate and Rhythm: Normal rate and regular rhythm.     Heart sounds: Normal heart sounds. No murmur. No friction rub. No gallop.   Pulmonary:     Effort: Pulmonary effort is normal. No respiratory distress.     Breath sounds: Normal breath sounds.  Abdominal:     General: Bowel sounds are normal. There is no distension.     Palpations: Abdomen is soft.     Tenderness: There is no abdominal tenderness. There is no guarding.  Musculoskeletal:        General: Normal range of motion.     Cervical back: Normal range of motion and neck supple.  Skin:    General: Skin is warm and dry.     Findings: No rash.     Comments: Extensive laceration of the skin and tendons of  the left hand as noted in the image.  Decreased sensation of the left fifth digit.  Poor capillary refill in the digit.  Radial pulse intact.  Unable to move digits of left hand.  There is active bleeding noted. 5th digit appears dusky.  Neurological:     Mental Status: He is alert.     Motor: No abnormal muscle tone.     Coordination: Coordination normal.         ED Results / Procedures / Treatments   Labs (all labs ordered are listed, but only abnormal results are displayed) Labs Reviewed  RESPIRATORY PANEL BY RT PCR (FLU A&B, COVID) - Abnormal; Notable for the following  components:      Result Value   SARS Coronavirus 2 by RT PCR POSITIVE (*)    All other components within normal limits    EKG EKG Interpretation  Date/Time:  Monday April 03 2019 12:26:13 EST Ventricular Rate:  93 PR Interval:    QRS Duration: 96 QT Interval:  356 QTC Calculation: 443 R Axis:   78 Text Interpretation: Sinus rhythm ST elev, probable normal early repol pattern Artifact in lead(s) II III aVL aVF No previous tracing Confirmed by Blanchie Dessert [phone]) on 04/03/2019 1:21:37 PM   Radiology DG Hand Complete Left  Result Date: 04/03/2019 CLINICAL DATA:  laceration. Cut arm with radial arm saw. 5th digit hanging off. EXAM: LEFT HAND - COMPLETE 3+ VIEW COMPARISON:  None. FINDINGS: There is no acute displaced comminuted fracture through the mid aspect of the 5th proximal phalanx. There is a fracture of the base of the fourth proximal phalanx, associated probable dislocation of the fourth metacarpophalangeal joint. There is an acute comminuted fracture of the third metacarpal. There is acute comminuted fracture of the second metacarpal, associated with significant anterior angulation. Soft tissue defect of the 5th digit. IMPRESSION: Acute fractures of the 5th proximal phalanx, fourth proximal phalanx, second metacarpal, and third metacarpal. Possible dislocation of the fourth metacarpophalangeal joint.  Electronically Signed   By: Nolon Nations M.D.   On: 04/03/2019 13:03    Procedures .Critical Care Performed by: Delia Heady, PA-C Authorized by: Delia Heady, PA-C   Critical care provider statement:    Critical care time (minutes):  35   Critical care time was exclusive of:  Separately billable procedures and treating other patients   Critical care was necessary to treat or prevent imminent or life-threatening deterioration of the following conditions:  Circulatory failure, trauma, shock and CNS failure or compromise   Critical care was time spent personally by me on the following activities:  Development of treatment plan with patient or surrogate, discussions with consultants, evaluation of patient's response to treatment, examination of patient, obtaining history from patient or surrogate, re-evaluation of patient's condition, pulse oximetry, ordering and review of radiographic studies, ordering and review of laboratory studies and ordering and performing treatments and interventions   I assumed direction of critical care for this patient from another provider in my specialty: no     (including critical care time)  Medications Ordered in ED Medications  chlorhexidine (HIBICLENS) 4 % liquid 4 application (has no administration in time range)  povidone-iodine 10 % swab 2 application (has no administration in time range)  ceFAZolin (ANCEF) IVPB 2g/100 mL premix (has no administration in time range)  HYDROmorphone (DILAUDID) injection 1 mg (1 mg Intravenous Given 04/03/19 1129)  HYDROmorphone (DILAUDID) injection 1 mg (1 mg Intravenous Given 04/03/19 1331)    ED Course  I have reviewed the triage vital signs and the nursing notes.  Pertinent labs & imaging results that were available during my care of the patient were reviewed by me and considered in my medical decision making (see chart for details).    MDM Rules/Calculators/A&P                      PEYTEN WEARE was evaluated  in Emergency Department on 04/03/19 for the symptoms described in the history of present illness. He/she was evaluated in the context of the global COVID-19 pandemic, which necessitated consideration that the patient might be at risk for infection with the SARS-CoV-2 virus that causes COVID-19. Institutional protocols  and algorithms that pertain to the evaluation of patients at risk for COVID-19 are in a state of rapid change based on information released by regulatory bodies including the CDC and federal and state organizations. These policies and algorithms were followed during the patient's care in the ED.  39 year old male presenting to the ED for extensive left hand laceration that occurred 1 hour prior to arrival.  There are visible lacerated tendons noted on the dorsum of the left hand with decreased sensation to his left fifth digit which is essentially hanging by a small piece of skin.  He is not anticoagulated.  I have consulted Earney Hamburg orthopedic PA who will see the patient in consult and will most likely go to the OR for repair.  Will give pain medication here as well as obtain Covid test.  Covid test is positive.  I have informed Ortho and the patient. Patient to OR.  Final Clinical Impression(s) / ED Diagnoses Final diagnoses:  Laceration of left hand, foreign body presence unspecified, initial encounter  Extensor tendon laceration of left hand with open wound, initial encounter  COVID-19 virus infection    Rx / DC Orders ED Discharge Orders    None     Portions of this note were generated with Dragon dictation software. Dictation errors may occur despite best attempts at proofreading.    Dietrich Pates, PA-C 04/03/19 1401    Gwyneth Sprout, MD 04/04/19 1512

## 2019-04-04 LAB — CBC WITH DIFFERENTIAL/PLATELET
Abs Immature Granulocytes: 0.03 10*3/uL (ref 0.00–0.07)
Basophils Absolute: 0 10*3/uL (ref 0.0–0.1)
Basophils Relative: 0 %
Eosinophils Absolute: 0 10*3/uL (ref 0.0–0.5)
Eosinophils Relative: 0 %
HCT: 38.9 % — ABNORMAL LOW (ref 39.0–52.0)
Hemoglobin: 12.9 g/dL — ABNORMAL LOW (ref 13.0–17.0)
Immature Granulocytes: 0 %
Lymphocytes Relative: 14 %
Lymphs Abs: 1.3 10*3/uL (ref 0.7–4.0)
MCH: 28 pg (ref 26.0–34.0)
MCHC: 33.2 g/dL (ref 30.0–36.0)
MCV: 84.6 fL (ref 80.0–100.0)
Monocytes Absolute: 0.9 10*3/uL (ref 0.1–1.0)
Monocytes Relative: 10 %
Neutro Abs: 7 10*3/uL (ref 1.7–7.7)
Neutrophils Relative %: 76 %
Platelets: 232 10*3/uL (ref 150–400)
RBC: 4.6 MIL/uL (ref 4.22–5.81)
RDW: 12.6 % (ref 11.5–15.5)
WBC: 9.2 10*3/uL (ref 4.0–10.5)
nRBC: 0 % (ref 0.0–0.2)

## 2019-04-04 LAB — HIV ANTIBODY (ROUTINE TESTING W REFLEX): HIV Screen 4th Generation wRfx: NONREACTIVE

## 2019-04-04 MED ORDER — ONDANSETRON HCL 4 MG PO TABS: 4.0000 mg | ORAL_TABLET | Freq: Four times a day (QID) | ORAL | 1 refills | Status: AC | PRN

## 2019-04-04 MED ORDER — OXYCODONE-ACETAMINOPHEN 5-325 MG PO TABS: 1.0000 | ORAL_TABLET | ORAL | 0 refills | Status: AC | PRN

## 2019-04-04 MED ORDER — CEPHALEXIN 500 MG PO CAPS: 500.0000 mg | ORAL_CAPSULE | Freq: Four times a day (QID) | ORAL | 0 refills | Status: AC

## 2019-04-04 MED ORDER — METHOCARBAMOL 500 MG PO TABS: 500.0000 mg | ORAL_TABLET | Freq: Four times a day (QID) | ORAL | 0 refills | Status: DC | PRN

## 2019-04-04 NOTE — Progress Notes (Signed)
Patient's head started hurting shortly before 5 am, patient also had nausea.  Patient was given prn IV morphine, initially for L hand pain. At about 6 am, patient starting having dry heaves.  Patient was given prn IV zofran.  Patient stated about 630 am that nausea was not improved and vomited green emesis.  Patient stated his headache was worse.  Nurse paged ortho doctor covering for Dr. Roney Mans.  Nurse was told that patient could have wife bring migraine spray Isaac Bliss) from home.  Day nurse given this info in report.

## 2019-04-04 NOTE — Discharge Instructions (Signed)
Discharge Instructions  - Keep dressings in place. Do not remove them. - The dressings must stay dry - Take all medication as prescribed. Transition to over the counter pain medication as your pain improves - Keep the hand elevated over the next 48-72 hours to help with pain and swelling - Move all digits not restricted by the dressings regularly to prevent stiffness - Please call to schedule a follow up appointment with Dr. Roney Mans and therapy at [phone] for 7-10  days following surgery - Your pain medication have been send digitally to your pharmacy

## 2019-04-04 NOTE — Transfer of Care (Signed)
Immediate Anesthesia Transfer of Care Note  Patient: [name] [name]  Procedure(s) Performed: IRRIGATION AND DEBRIDEMENT open fracture (Left Hand) Index Finger Metacarpal Open Reduction Internal Fixation (Orif) (Left Finger) Revision Amputation Small Finger (Left Hand) Minor Open Reduction Internal Fixation (Orif) Middle Finger (Left Finger) Ulnar and Radial Collateral Ligament Repair to MP (Left Hand) Repair Extensor Tendon of index, middle and ring fingers (Left Hand) Flexor Tendon Repair Left Ring Finger (Left Finger)  Patient Location: PACU - in OR 8 for covid recovery by pacu RN    Anesthesia Type:General  Level of Consciousness: awake, alert , oriented and patient cooperative  Airway & Oxygen Therapy: Patient Spontanous Breathing and Patient connected to nasal cannula oxygen  Post-op Assessment: Report given to RN and Post -op Vital signs reviewed and stable  Post vital signs: Reviewed and stable  Last Vitals:  Vitals Value Taken Time  BP 119/78 04/04/19 0458  Temp 37.1 C 04/04/19 0458  Pulse 90 04/04/19 0458  Resp 16 04/04/19 0458  SpO2 97 % 04/04/19 0458    Last Pain:  Vitals:   04/04/19 0912  TempSrc:   PainSc: Asleep      Patients Stated Pain Goal: 2 (04/03/19 2344)  Complications: No apparent anesthesia complications

## 2019-04-04 NOTE — Progress Notes (Signed)
Nsg Discharge Note  Admit Date:  04/03/2019 Discharge date: 04/04/2019   KAYCEON OKI to be D/C'd Home per MD order.  AVS completed.    Discharge Medication: Allergies as of 04/04/2019      Reactions   Hydrocodone Itching   Pt reports he can tolerate when taken with benadryl    Oxycodone Itching   Pt reports he can tolerate when taken with benadryl    Cinnamon Nausea And Vomiting   migraines      Medication List    TAKE these medications   baclofen 10 MG tablet Commonly known as: LIORESAL Take 5 mg by mouth at bedtime.   butalbital-acetaminophen-caffeine 50-325-40 MG tablet Commonly known as: FIORICET Take 1-2 tablets by mouth every 6 (six) hours as needed for headache.   cephALEXin 500 MG capsule Commonly known as: KEFLEX Take 1 capsule (500 mg total) by mouth 4 (four) times daily for 10 days.   diphenhydrAMINE 25 MG tablet Commonly known as: BENADRYL Take 25 mg by mouth every 6 (six) hours as needed for itching.   docusate sodium 100 MG capsule Commonly known as: COLACE Take 100 mg by mouth daily as needed for mild constipation.   DULoxetine 60 MG capsule Commonly known as: CYMBALTA Take 120 mg by mouth daily.   eletriptan 40 MG tablet Commonly known as: RELPAX Take 40 mg by mouth every 2 (two) hours as needed for migraine or headache. May repeat in 2 hours if headache persists or recurs.   gabapentin 600 MG tablet Commonly known as: NEURONTIN Take 300 mg by mouth 3 (three) times daily.   HYDROcodone-acetaminophen 5-325 MG tablet Commonly known as: NORCO/VICODIN Take 1-2 tablets by mouth every 6 (six) hours as needed for moderate pain.   methocarbamol 500 MG tablet Commonly known as: ROBAXIN Take 1 tablet (500 mg total) by mouth every 6 (six) hours as needed for muscle spasms.   NASACORT ALLERGY 24HR NA Place 1 spray into both nostrils daily.   omeprazole 20 MG capsule Commonly known as: PRILOSEC Take 40 mg by mouth daily.   ondansetron 4 MG  tablet Commonly known as: ZOFRAN Take 1 tablet (4 mg total) by mouth every 6 (six) hours as needed for nausea.   Onzetra Xsail 11 MG/NOSEPC Exhp Generic drug: SUMAtriptan Succinate Place 1 spray into both nostrils daily as needed (migraine).   oxyCODONE-acetaminophen 5-325 MG tablet Commonly known as: Percocet Take 1-2 tablets by mouth every 4 (four) hours as needed for up to 7 days for severe pain. What changed:   how much to take  when to take this   oxymetazoline 0.05 % nasal spray Commonly known as: AFRIN Place 1 spray into both nostrils daily.   polyethylene glycol 17 g packet Commonly known as: MIRALAX / GLYCOLAX Take 17 g by mouth every other day.   predniSONE 20 MG tablet Commonly known as: DELTASONE 3 po once a day for 2 days, then 2 po once a day for 3 days, then 1 po once a day for 3 days   sodium chloride 0.65 % Soln nasal spray Commonly known as: OCEAN Place 1 spray into both nostrils daily.   traMADol 50 MG tablet Commonly known as: ULTRAM Take 50 mg by mouth 3 (three) times daily.   traZODone 50 MG tablet Commonly known as: DESYREL Take 50 mg by mouth at bedtime.   VITAMIN D PO Take 1 tablet by mouth daily.       Discharge Assessment: Vitals:   04/04/19 0020  04/04/19 0458  BP:  119/78  Pulse:  90  Resp:  16  Temp:  98.8 F (37.1 C)  SpO2: 99% 97%   Skin clean, dry and intact without evidence of skin break down, no evidence of skin tears noted. IV catheter discontinued intact. Site without signs and symptoms of complications - no redness or edema noted at insertion site, patient denies c/o pain - only slight tenderness at site.  Dressing with slight pressure applied.  D/c Instructions-Education: Discharge instructions given to patient/family with verbalized understanding. D/c education completed with patient/family including follow up instructions, medication list, d/c activities limitations if indicated, with other d/c instructions as  indicated by MD - patient able to verbalize understanding, all questions fully answered. Patient instructed to return to ED, call 911, or call MD for any changes in condition.  Patient escorted via WC, and D/C home via private auto.  Lyndal Pulley, RN 04/04/2019 9:46 AM

## 2019-04-04 NOTE — Progress Notes (Signed)
Occupational Therapy Evaluation Patient Details Name: [name] [name] MRN: [id number] DOB: [birth year] Today's Date: 04/04/2019    History of Present Illness  Patient is a 39 year old male who earlier today sustained a severe injury to the left hand from a radial arm saw at work.  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    Clinical Impression   PTA, pt was Modified Independent for ADLs/IADLs and mobility using cane. Pt lives with wife and children who can provide support and assistance as needed. Presently, pt with increased pain (10/10 migraine, 6/10 L hand) with L hand/forearm wrapped in dressings and splinted. However, despite pain, pt able to complete mobility tasks Independently without AD. Pt also able to complete LB dressing and oral care standing at sink, utilizing one-handed strategies. Reinforced elevation of L UE, pain mgmt strategies (provided ice pack), and continuation of one-handed techniques upon DC. Recommend follow-up with OT services at outpatient level and continuation of following surgeon's recommendations.     Follow Up Recommendations  Outpatient OT;Follow surgeon's recommendation for DC plan and follow-up therapies    Equipment Recommendations  Other (comment)    Recommendations for Other Services       Precautions / Restrictions Precautions Precautions: Other (comment) Precaution Comments: Assume NWB L UE, dressings/splint in place Splint/Cast - Date Prophylactic Dressing Applied (if applicable): 04/03/19 Restrictions Weight Bearing Restrictions: Yes LUE Weight Bearing: Non weight bearing(conservative approach) Other Position/Activity Restrictions:  Keep L UE elevated       Mobility Bed Mobility                  Transfers Overall transfer level: Independent Equipment used: None             General transfer comment: Pt typically uses cane at baseline, but able to transfer/ambulate without AD, no LOB    Balance Overall balance assessment: Mild deficits observed, not formally tested(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)                                         ADL either performed or assessed with clinical judgement   ADL Overall ADL's : Independent                                             Vision Baseline Vision/History: No visual deficits       Perception     Praxis      Pertinent Vitals/Pain Pain Assessment: 0-10 Pain Score: 10-Worst pain ever(in head, 6 in L hand) Pain Location: headache, L hand Pain Descriptors / Indicators: Headache;Sore Pain Intervention(s): Limited activity within patient's tolerance;Monitored during session;Premedicated before session;Repositioned     Hand Dominance Right   Extremity/Trunk Assessment Upper Extremity Assessment Upper Extremity Assessment: LUE deficits/detail LUE Deficits / Details: NWB, L hand/forearm wrapped and splinted LUE: Unable to fully assess due to immobilization LUE Sensation: decreased light touch(numbness, tingling; phantom sensations) LUE Coordination: decreased fine motor  Lower Extremity Assessment Lower Extremity Assessment: Defer to PT evaluation       Communication Communication Communication: No difficulties   Cognition Arousal/Alertness: Awake/alert Behavior During Therapy: WFL for tasks assessed/performed Overall Cognitive Status: Within Functional Limits for tasks assessed                                     General Comments  L hand/forearm wrapped in dressing, splinted; edema noted in L digit tips exposed    Exercises     Shoulder Instructions      Home  Living Family/patient expects to be discharged to:: Private residence Living Arrangements: Spouse/significant other;Children Available Help at Discharge: Family Type of Home: House Home Access: Ramped entrance           Bathroom Shower/Tub: Walk-in [employer] Toilet: [place] - single point          Prior Functioning/Environment Level of Independence: Independent        Comments: on disability        OT Problem List: Decreased strength;Decreased range of motion;Decreased coordination;Impaired sensation;Impaired UE functional use;Increased edema;Pain      OT Treatment/Interventions:      OT Goals(Current goals can be found in the care plan section) Acute Rehab OT Goals Patient Stated Goal: go home OT Goal Formulation: With patient  OT Frequency:     Barriers to D/C:            Co-evaluation              AM-PAC OT "6 Clicks" Daily Activity     Outcome Measure Help from another person eating meals?: None Help from another person taking care of personal grooming?: None Help from another person toileting, which includes using toliet, bedpan, or urinal?: None Help from another person bathing (including washing, rinsing, drying)?: None Help from another person to put on and taking off regular upper body clothing?: None Help from another person to put on and taking off regular lower body clothing?: None 6 Click Score: 24   End of Session Equipment Utilized During Treatment: Other (comment)(None) Nurse Communication: Mobility status  Activity Tolerance: Patient tolerated treatment well(Tolerated tx well despite pain) Patient left: in bed;with call bell/phone within reach  OT Visit Diagnosis: Muscle weakness (generalized) (M62.81);Other (comment)(Lack of coordination)                Time: 3295-1884 OT Time Calculation (min): 18 min Charges:  OT General Charges $OT Visit: 1 Visit OT Evaluation $OT Eval Moderate  Complexity: 1 Mod  Layla Maw, OTR/L  Layla Maw 04/04/2019, 10:46 AM

## 2019-04-04 NOTE — Discharge Summary (Signed)
Patient ID: AZAAN LEASK MRN: [id number] DOB/AGE: 40-05-1980 40 y.o.  Admit date: 04/03/2019 Discharge date: 04/04/2019  Admission Diagnoses: Left hand laceration History reviewed. No pertinent past medical history.  Discharge Diagnoses:  Active Problems:   S/P debridement   Contact with powered saw as cause of accidental injury   Surgeries: Procedure(s): IRRIGATION AND DEBRIDEMENT open fracture Index Finger Metacarpal Open Reduction Internal Fixation (Orif) Revision Amputation Small Finger Minor Open Reduction Internal Fixation (Orif) Middle Finger Ulnar and Radial Collateral Ligament Repair to MP Repair Extensor Tendon of index, middle and ring fingers Flexor Tendon Repair Left Ring Finger on 04/03/2019    Consultants: Treatment Team:  Verner Mould, MD  Discharged Condition: Improved  Hospital Course: ANTONIUS HARTLAGE is an 39 y.o. male who was admitted 04/03/2019 with a chief complaint of  Chief Complaint  Patient presents with  . Laceration    L hand  , and found to have a diagnosis of Left hand laceration.  They were brought to the operating room on 04/03/2019 and underwent Procedure(s): IRRIGATION AND DEBRIDEMENT open fracture Index Finger Metacarpal Open Reduction Internal Fixation (Orif) Revision Amputation Small Finger Minor Open Reduction Internal Fixation (Orif) Middle Finger Ulnar and Radial Collateral Ligament Repair to MP Repair Extensor Tendon of index, middle and ring fingers Flexor Tendon Repair Left Ring Finger.    They were given perioperative antibiotics:  Anti-infectives (From admission, onward)   Start     Dose/Rate Route Frequency Ordered Stop   04/04/19 0600  ceFAZolin (ANCEF) IVPB 2g/100 mL premix     2 g 200 mL/hr over 30 Minutes Intravenous On call to O.R. 04/03/19 1333 04/03/19 1533   04/04/19 0000  cephALEXin (KEFLEX) 500 MG capsule     500 mg Oral 4 times daily 04/04/19 0925 04/14/19 2359   04/03/19 2200  ceFAZolin (ANCEF) IVPB 2g/100  mL premix  Status:  Discontinued     2 g 200 mL/hr over 30 Minutes Intravenous Every 8 hours 04/03/19 1819 04/04/19 1514    .  They were given sequential compression devices, early ambulation  for DVT prophylaxis.  Recent vital signs:  Patient Vitals for the past 24 hrs:  BP Temp Temp src Pulse Resp SpO2 Weight  04/04/19 0512 -- -- -- -- -- -- 78.7 kg  04/04/19 0458 119/78 98.8 F (37.1 C) Oral 90 16 97 % --  04/04/19 0020 -- -- -- -- -- 99 % --  04/03/19 1953 121/83 98.3 F (36.8 C) Oral 97 16 100 % --  04/03/19 1848 120/75 97.6 F (36.4 C) -- 99 15 94 % --  04/03/19 1838 120/77 -- -- 100 15 98 % --  04/03/19 1823 118/78 97.8 F (36.6 C) -- 98 14 96 % --  .  Recent laboratory studies: DG Hand Complete Left  Result Date: 04/03/2019 CLINICAL DATA:  laceration. Cut arm with radial arm saw. 5th digit hanging off. EXAM: LEFT HAND - COMPLETE 3+ VIEW COMPARISON:  None. FINDINGS: There is no acute displaced comminuted fracture through the mid aspect of the 5th proximal phalanx. There is a fracture of the base of the fourth proximal phalanx, associated probable dislocation of the fourth metacarpophalangeal joint. There is an acute comminuted fracture of the third metacarpal. There is acute comminuted fracture of the second metacarpal, associated with significant anterior angulation. Soft tissue defect of the 5th digit. IMPRESSION: Acute fractures of the 5th proximal phalanx, fourth proximal phalanx, second metacarpal, and third metacarpal. Possible dislocation of the fourth metacarpophalangeal  joint. Electronically Signed   By: Norva Pavlov M.D.   On: 04/03/2019 13:03    Discharge Medications:   Allergies as of 04/04/2019      Reactions   Hydrocodone Itching   Pt reports he can tolerate when taken with benadryl    Oxycodone Itching   Pt reports he can tolerate when taken with benadryl    Cinnamon Nausea And Vomiting   migraines      Medication List    TAKE these medications    baclofen 10 MG tablet Commonly known as: LIORESAL Take 5 mg by mouth at bedtime.   butalbital-acetaminophen-caffeine 50-325-40 MG tablet Commonly known as: FIORICET Take 1-2 tablets by mouth every 6 (six) hours as needed for headache.   cephALEXin 500 MG capsule Commonly known as: KEFLEX Take 1 capsule (500 mg total) by mouth 4 (four) times daily for 10 days.   diphenhydrAMINE 25 MG tablet Commonly known as: BENADRYL Take 25 mg by mouth every 6 (six) hours as needed for itching.   docusate sodium 100 MG capsule Commonly known as: COLACE Take 100 mg by mouth daily as needed for mild constipation.   DULoxetine 60 MG capsule Commonly known as: CYMBALTA Take 120 mg by mouth daily.   eletriptan 40 MG tablet Commonly known as: RELPAX Take 40 mg by mouth every 2 (two) hours as needed for migraine or headache. May repeat in 2 hours if headache persists or recurs.   gabapentin 600 MG tablet Commonly known as: NEURONTIN Take 300 mg by mouth 3 (three) times daily.   HYDROcodone-acetaminophen 5-325 MG tablet Commonly known as: NORCO/VICODIN Take 1-2 tablets by mouth every 6 (six) hours as needed for moderate pain.   methocarbamol 500 MG tablet Commonly known as: ROBAXIN Take 1 tablet (500 mg total) by mouth every 6 (six) hours as needed for muscle spasms.   NASACORT ALLERGY 24HR NA Place 1 spray into both nostrils daily.   omeprazole 20 MG capsule Commonly known as: PRILOSEC Take 40 mg by mouth daily.   ondansetron 4 MG tablet Commonly known as: ZOFRAN Take 1 tablet (4 mg total) by mouth every 6 (six) hours as needed for nausea.   Onzetra Xsail 11 MG/NOSEPC Exhp Generic drug: SUMAtriptan Succinate Place 1 spray into both nostrils daily as needed (migraine).   oxyCODONE-acetaminophen 5-325 MG tablet Commonly known as: Percocet Take 1-2 tablets by mouth every 4 (four) hours as needed for up to 7 days for severe pain. What changed:   how much to take  when to take  this   oxymetazoline 0.05 % nasal spray Commonly known as: AFRIN Place 1 spray into both nostrils daily.   polyethylene glycol 17 g packet Commonly known as: MIRALAX / GLYCOLAX Take 17 g by mouth every other day.   predniSONE 20 MG tablet Commonly known as: DELTASONE 3 po once a day for 2 days, then 2 po once a day for 3 days, then 1 po once a day for 3 days   sodium chloride 0.65 % Soln nasal spray Commonly known as: OCEAN Place 1 spray into both nostrils daily.   traMADol 50 MG tablet Commonly known as: ULTRAM Take 50 mg by mouth 3 (three) times daily.   traZODone 50 MG tablet Commonly known as: DESYREL Take 50 mg by mouth at bedtime.   VITAMIN D PO Take 1 tablet by mouth daily.       Diagnostic Studies: DG Hand Complete Left  Result Date: 04/03/2019 CLINICAL DATA:  laceration. Cut  arm with radial arm saw. 5th digit hanging off. EXAM: LEFT HAND - COMPLETE 3+ VIEW COMPARISON:  None. FINDINGS: There is no acute displaced comminuted fracture through the mid aspect of the 5th proximal phalanx. There is a fracture of the base of the fourth proximal phalanx, associated probable dislocation of the fourth metacarpophalangeal joint. There is an acute comminuted fracture of the third metacarpal. There is acute comminuted fracture of the second metacarpal, associated with significant anterior angulation. Soft tissue defect of the 5th digit. IMPRESSION: Acute fractures of the 5th proximal phalanx, fourth proximal phalanx, second metacarpal, and third metacarpal. Possible dislocation of the fourth metacarpophalangeal joint. Electronically Signed   By: Norva Pavlov M.D.   On: 04/03/2019 13:03    They benefited maximally from their hospital stay and there were no complications.     Disposition: Discharge disposition: 01-Home or Self Care      Discharge Instructions    Call MD / Call 911   Complete by: As directed    If you experience chest pain or shortness of breath, CALL 911  and be transported to the hospital emergency room.  If you develope a fever above 101 F, pus (white drainage) or increased drainage or redness at the wound, or calf pain, call your surgeon's office.   Constipation Prevention   Complete by: As directed    Drink plenty of fluids.  Prune juice may be helpful.  You may use a stool softener, such as Colace (over the counter) 100 mg twice a day.  Use MiraLax (over the counter) for constipation as needed.   Diet - low sodium heart healthy   Complete by: As directed    Increase activity slowly as tolerated   Complete by: As directed      Follow-up Information    [name], MD. Schedule an appointment as soon as possible for a visit in 1 week(s).   Contact information: [address] [place] [address] [phone]            Signed: Ernest Mallick, MD  04/04/2019, 5:03 PM

## 2019-04-04 NOTE — Plan of Care (Signed)
  Problem: Education: Goal: Knowledge of General Education information will improve Description: Including pain rating scale, medication(s)/side effects and non-pharmacologic comfort measures Outcome: Progressing   Problem: Health Behavior/Discharge Planning: Goal: Ability to manage health-related needs will improve Outcome: Progressing   Problem: Clinical Measurements: Goal: Ability to maintain clinical measurements within normal limits will improve Outcome: Progressing Goal: Will remain free from infection Outcome: Progressing Goal: Diagnostic test results will improve Outcome: Progressing Goal: Respiratory complications will improve Outcome: Progressing Goal: Cardiovascular complication will be avoided Outcome: Progressing   Problem: Activity: Goal: Risk for activity intolerance will decrease Outcome: Progressing   Problem: Nutrition: Goal: Adequate nutrition will be maintained Outcome: Progressing   Problem: Coping: Goal: Level of anxiety will decrease Outcome: Progressing   Problem: Elimination: Goal: Will not experience complications related to bowel motility Outcome: Progressing Goal: Will not experience complications related to urinary retention Outcome: Progressing   Problem: Pain Managment: Goal: General experience of comfort will improve Outcome: Progressing   Problem: Safety: Goal: Ability to remain free from injury will improve Outcome: Progressing   Problem: Skin Integrity: Goal: Risk for impaired skin integrity will decrease Outcome: Progressing   Problem: Education: Goal: Knowledge of risk factors and measures for prevention of condition will improve Outcome: Progressing   Problem: Coping: Goal: Psychosocial and spiritual needs will be supported Outcome: Progressing   Problem: Respiratory: Goal: Will maintain a patent airway Outcome: Progressing Goal: Complications related to the disease process, condition or treatment will be avoided or  minimized Outcome: Progressing   Problem: Education: Goal: Knowledge of the prescribed therapeutic regimen will improve Outcome: Progressing Goal: Ability to verbalize activity precautions or restrictions will improve Outcome: Progressing Goal: Understanding of discharge needs will improve Outcome: Progressing   Problem: Activity: Goal: Ability to perform//tolerate increased activity and mobilize with assistive devices will improve Outcome: Progressing   Problem: Clinical Measurements: Goal: Postoperative complications will be avoided or minimized Outcome: Progressing   Problem: Self-Care: Goal: Ability to meet self-care needs will improve Outcome: Progressing   Problem: Self-Concept: Goal: Ability to maintain and perform role responsibilities to the fullest extent possible will improve Outcome: Progressing   Problem: Pain Management: Goal: Pain level will decrease with appropriate interventions Outcome: Progressing   Problem: Skin Integrity: Goal: Demonstration of wound healing without infection will improve Outcome: Progressing

## 2019-04-04 NOTE — Progress Notes (Signed)
   Ortho Hand Progress Note  Subjective: No acute events last night. Had some increased pain and nausea/vomiting this AM. States its likely because he has not taken some of his home meds. Wants to go home this morning.   Objective: Vital signs in last 24 hours: Temp:  [97.6 F (36.4 C)-98.8 F (37.1 C)] 98.8 F (37.1 C) (02/09 0458) Pulse Rate:  [86-115] 90 (02/09 0458) Resp:  [10-22] 16 (02/09 0458) BP: (118-143)/(75-89) 119/78 (02/09 0458) SpO2:  [91 %-100 %] 97 % (02/09 0458) Weight:  [74.8 kg-78.7 kg] 78.7 kg (02/09 0512)  Intake/Output from previous day: 02/08 0701 - 02/09 0700 In: 1830 [P.O.:480; I.V.:1000; IV Piggyback:350] Out: 1800 [Urine:1800] Intake/Output this shift: No intake/output data recorded.  Recent Labs    04/04/19 0414  HGB 12.9*   Recent Labs    04/04/19 0414  WBC 9.2  RBC 4.60  HCT 38.9*  PLT 232   No results for input(s): NA, K, CL, CO2, BUN, CREATININE, GLUCOSE, CALCIUM in the last 72 hours. No results for input(s): LABPT, INR in the last 72 hours.  Aaox3 nad Resp nonlabored RRR LUE: Dressings/splint in place. Exposed digits wwp with bcr. SILT to radial and ulnar aspects of thumb thru ring fingers. Small finger has been removed   Assessment/Plan: Left hand saw injury s/p I&D, repair and reconstruction. POD1 COVID-19 positive  - Pain reasonably well controlled - states his main complaint is headace/nausea/vomiting which is likely due to not taking some of his home meds - Patient would like to d/c today which I do think I acceptable - Continue LUE elevation and dressings. - Will need follow up with me in 7-10 for wound check - PO plus iv pain meds - regular diet - COVID positive. Asymptomatic  Dispo: d/c home   [name] [name] 04/04/2019, 9:16 AM  [phone]

## 2019-04-05 ENCOUNTER — Encounter: Payer: Self-pay | Admitting: *Deleted

## 2019-04-10 NOTE — Anesthesia Postprocedure Evaluation (Signed)
Anesthesia Post Note  Patient: [name] [name]  Procedure(s) Performed: IRRIGATION AND DEBRIDEMENT open fracture (Left Hand) Index Finger Metacarpal Open Reduction Internal Fixation (Orif) (Left Finger) Revision Amputation Small Finger (Left Hand) Minor Open Reduction Internal Fixation (Orif) Middle Finger (Left Finger) Ulnar and Radial Collateral Ligament Repair to MP (Left Hand) Repair Extensor Tendon of index, middle and ring fingers (Left Hand) Flexor Tendon Repair Left Ring Finger (Left Finger)     Patient location during evaluation: PACU Anesthesia Type: General Level of consciousness: awake and alert Pain management: pain level controlled Vital Signs Assessment: post-procedure vital signs reviewed and stable Respiratory status: spontaneous breathing, nonlabored ventilation, respiratory function stable and patient connected to nasal cannula oxygen Cardiovascular status: blood pressure returned to baseline and stable Postop Assessment: no apparent nausea or vomiting Anesthetic complications: no    Last Vitals:  Vitals:   04/04/19 0020 04/04/19 0458  BP:  119/78  Pulse:  90  Resp:  16  Temp:  37.1 C  SpO2: 99% 97%    Last Pain:  Vitals:   04/04/19 0912  TempSrc:   PainSc: Asleep                 Shelton Silvas

## 2019-04-12 DIAGNOSIS — M79642 Pain in left hand: Secondary | ICD-10-CM | POA: Insufficient documentation

## 2019-04-27 DIAGNOSIS — S61402D Unspecified open wound of left hand, subsequent encounter: Secondary | ICD-10-CM | POA: Insufficient documentation

## 2019-04-27 DIAGNOSIS — M47812 Spondylosis without myelopathy or radiculopathy, cervical region: Secondary | ICD-10-CM | POA: Insufficient documentation

## 2019-09-04 DIAGNOSIS — H25013 Cortical age-related cataract, bilateral: Secondary | ICD-10-CM | POA: Insufficient documentation

## 2019-09-04 DIAGNOSIS — H52223 Regular astigmatism, bilateral: Secondary | ICD-10-CM | POA: Insufficient documentation

## 2019-10-05 DIAGNOSIS — G5601 Carpal tunnel syndrome, right upper limb: Secondary | ICD-10-CM | POA: Insufficient documentation

## 2020-12-13 ENCOUNTER — Encounter (HOSPITAL_BASED_OUTPATIENT_CLINIC_OR_DEPARTMENT_OTHER): Payer: Self-pay

## 2020-12-13 ENCOUNTER — Emergency Department (HOSPITAL_BASED_OUTPATIENT_CLINIC_OR_DEPARTMENT_OTHER)
Admission: EM | Admit: 2020-12-13 | Discharge: 2020-12-13 | Disposition: A | Discharge status: Home / Self Care | Payer: Medicare Other | Attending: Emergency Medicine | Admitting: Emergency Medicine

## 2020-12-13 ENCOUNTER — Other Ambulatory Visit: Payer: Self-pay

## 2020-12-13 DIAGNOSIS — Z87891 Personal history of nicotine dependence: Secondary | ICD-10-CM | POA: Diagnosis not present

## 2020-12-13 DIAGNOSIS — X501XXA Overexertion from prolonged static or awkward postures, initial encounter: Secondary | ICD-10-CM | POA: Insufficient documentation

## 2020-12-13 DIAGNOSIS — M546 Pain in thoracic spine: Secondary | ICD-10-CM

## 2020-12-13 DIAGNOSIS — Y9389 Activity, other specified: Secondary | ICD-10-CM | POA: Diagnosis not present

## 2020-12-13 HISTORY — DX: Dorsalgia, unspecified: M54.9

## 2020-12-13 LAB — URINALYSIS, ROUTINE W REFLEX MICROSCOPIC
Bilirubin Urine: NEGATIVE
Glucose, UA: NEGATIVE mg/dL
Hgb urine dipstick: NEGATIVE
Ketones, ur: NEGATIVE mg/dL
Leukocytes,Ua: NEGATIVE
Nitrite: NEGATIVE
Protein, ur: NEGATIVE mg/dL
Specific Gravity, Urine: 1.02 (ref 1.005–1.030)
pH: 7.5 (ref 5.0–8.0)

## 2020-12-13 MED ORDER — PREDNISONE 10 MG (21) PO TBPK: ORAL_TABLET | Freq: Every day | ORAL | 0 refills | Status: DC

## 2020-12-13 MED ORDER — METHOCARBAMOL 500 MG PO TABS: 500.0000 mg | ORAL_TABLET | Freq: Two times a day (BID) | ORAL | 0 refills | Status: AC

## 2020-12-13 MED ORDER — MORPHINE SULFATE (PF) 4 MG/ML IV SOLN
4.0000 mg | Freq: Once | INTRAVENOUS | Status: AC
  Administered 2020-12-13: 4 mg via INTRAMUSCULAR
  Filled 2020-12-13: qty 1

## 2020-12-13 MED ORDER — IBUPROFEN 400 MG PO TABS
600.0000 mg | ORAL_TABLET | Freq: Once | ORAL | Status: AC
  Administered 2020-12-13: 600 mg via ORAL
  Filled 2020-12-13: qty 1

## 2020-12-13 MED ORDER — PREDNISONE 20 MG PO TABS
40.0000 mg | ORAL_TABLET | Freq: Once | ORAL | Status: AC
  Administered 2020-12-13: 40 mg via ORAL
  Filled 2020-12-13: qty 2

## 2020-12-13 NOTE — Discharge Instructions (Addendum)
Please continue to follow-up with your spine surgeon.  I recommend that you use the muscle relaxer prescribed ER along with Tylenol and ibuprofen you may alternate between the 2 as discussed below.  I have also prescribed you prednisone please continue take as prescribed it is a taper dose   I also strongly recommend Voltaren cream which is an over-the-counter medicine.  You may also use lidocaine patches which can be bought at [facility] You may always return to the ER for any new or concerning symptoms.  Ultimately noted very high suspicion for this being a herniated disc given the situation that you described.

## 2020-12-13 NOTE — ED Triage Notes (Signed)
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

## 2020-12-13 NOTE — ED Notes (Signed)
ED Provider at bedside. 

## 2020-12-13 NOTE — ED Provider Notes (Signed)
[facility] EMERGENCY DEPARTMENT Provider Note   CSN: [id number] Arrival date & time: 12/13/20  1854     History Chief Complaint  Patient presents with   Back Pain    [name] [name] is a 40 y.o. male.  HPI Patient is a 40 year old male with past medical history significant for chronic low back pain has a spinal stimulator in his low back.  Patient is presented to the ER today with acute onset of midthoracic/middle back pain states this started this morning around 9:30 AM when he bent over to pick up a shop vac and lifted with his back and felt a pop and had sudden onset of severe back pain.  Has been ongoing constant since that time.  He states it is severe 10/10 sharp and seems to radiate down his spine slightly denies any numbness or weakness of his lower extremities no bowel or bladder incontinence no saddle anesthesia.  No difficulty walking apart from some difficulty walking due to the pain.  He denies any disequilibrium.  No fall or injuries.  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  Denies any head injuries or loss of consciousness nausea vomiting no visual symptoms.    Past Medical History:  Diagnosis Date   Back pain     Patient Active Problem List   Diagnosis Date Noted   S/P debridement 04/03/2019   Contact with powered saw as cause of accidental injury 04/03/2019    Past Surgical History:  Procedure Laterality Date   AMPUTATION FINGER Left 04/03/2019   Procedure: Revision Amputation Small Finger;  Surgeon: Ernest Mallick, MD;  Location: [facility] OR;  Service: Orthopedics;  Laterality: Left;   APPENDECTOMY     FLEXOR TENDON REPAIR Left 04/03/2019   Procedure: Flexor Tendon Repair Left Ring Finger;  Surgeon: Ernest Mallick, MD;  Location: [facility] OR;  Service: Orthopedics;  Laterality: Left;   HAND SURGERY     I & D EXTREMITY Left 04/03/2019   Procedure: IRRIGATION AND DEBRIDEMENT open fracture;  Surgeon:  Ernest Mallick, MD;  Location: [facility] OR;  Service: Orthopedics;  Laterality: Left;   MINOR OPEN REDUCTION INTERNAL FIXATION (ORIF) FINGER Left 04/03/2019   Procedure: Minor Open Reduction Internal Fixation (Orif) Middle Finger;  Surgeon: Ernest Mallick, MD;  Location: [facility] OR;  Service: Orthopedics;  Laterality: Left;   OPEN REDUCTION INTERNAL FIXATION (ORIF) METACARPAL Left 04/03/2019   Procedure: Index Finger Metacarpal Open Reduction Internal Fixation (Orif);  Surgeon: Ernest Mallick, MD;  Location: [facility] OR;  Service: Orthopedics;  Laterality: Left;   RADIOFREQUENCY ABLATION NERVES     REPAIR EXTENSOR TENDON Left 04/03/2019   Procedure: Repair Extensor Tendon of index, middle and ring fingers;  Surgeon: Ernest Mallick, MD;  Location: [facility] OR;  Service: Orthopedics;  Laterality: Left;   sinusectomy     SPINAL CORD STIMULATOR INSERTION     ULNAR COLLATERAL LIGAMENT REPAIR Left 04/03/2019   Procedure: Ulnar and Radial Collateral Ligament Repair to MP;  Surgeon: Ernest Mallick, MD;  Location: [facility] OR;  Service: Orthopedics;  Laterality: Left;   VASCULAR SURGERY         No family history on file.  Social History   Tobacco Use   Smoking status: Former    Types: Cigarettes   Smokeless tobacco: Never  Vaping Use   Vaping Use: Never used  Substance Use Topics   Alcohol use: No  Drug use: No    Home Medications Prior to Admission medications   Medication Sig Start Date End Date Taking? Authorizing Provider  methocarbamol (ROBAXIN) 500 MG tablet Take 1 tablet (500 mg total) by mouth 2 (two) times daily. 12/13/20  Yes Jeryn Bertoni S, PA  predniSONE (STERAPRED UNI-PAK 21 TAB) 10 MG (21) TBPK tablet Take by mouth daily. Take 6 tabs by mouth daily  for 2 days, then 5 tabs for 2 days, then 4 tabs for 2 days, then 3 tabs for 2 days, 2 tabs for 2 days, then 1 tab by mouth daily for 2 days 12/13/20  Yes Alayjah Boehringer S, PA  baclofen (LIORESAL) 10 MG tablet Take 5  mg by mouth at bedtime. 01/30/19   [provider]  butalbital-acetaminophen-caffeine (FIORICET, ESGIC) 50-325-40 MG tablet Take 1-2 tablets by mouth every 6 (six) hours as needed for headache. Patient not taking: Reported on 04/03/2019 04/25/17   Robinson, [place] N, PA-C  diphenhydrAMINE (BENADRYL) 25 MG tablet Take 25 mg by mouth every 6 (six) hours as needed for itching.    [provider]  docusate sodium (COLACE) 100 MG capsule Take 100 mg by mouth daily as needed for mild constipation.    [provider]  DULoxetine (CYMBALTA) 60 MG capsule Take 120 mg by mouth daily. 03/15/19   [provider]  eletriptan (RELPAX) 40 MG tablet Take 40 mg by mouth every 2 (two) hours as needed for migraine or headache. May repeat in 2 hours if headache persists or recurs.    [provider]  gabapentin (NEURONTIN) 600 MG tablet Take 300 mg by mouth 3 (three) times daily. 03/03/19   [provider]  HYDROcodone-acetaminophen (NORCO/VICODIN) 5-325 MG tablet Take 1-2 tablets by mouth every 6 (six) hours as needed for moderate pain. Patient not taking: Reported on 04/25/2017 08/13/16   Cathren Laine, MD  omeprazole (PRILOSEC) 20 MG capsule Take 40 mg by mouth daily.    [provider]  ondansetron (ZOFRAN) 4 MG tablet Take 1 tablet (4 mg total) by mouth every 6 (six) hours as needed for nausea. 04/04/19   [name], MD  oxymetazoline (AFRIN) 0.05 % nasal spray Place 1 spray into both nostrils daily.     [provider]  polyethylene glycol (MIRALAX / GLYCOLAX) packet Take 17 g by mouth every other day.     [provider]  sodium chloride (OCEAN) 0.65 % SOLN nasal spray Place 1 spray into both nostrils daily.    [provider]  SUMAtriptan Succinate (ONZETRA XSAIL) 11 MG/NOSEPC EXHP Place 1 spray into both nostrils daily as needed (migraine).    [provider]  traMADol (ULTRAM) 50 MG tablet Take 50 mg by mouth 3  (three) times daily.  03/19/17   [provider]  traZODone (DESYREL) 50 MG tablet Take 50 mg by mouth at bedtime. 02/13/19   [provider]  Triamcinolone Acetonide (NASACORT ALLERGY 24HR NA) Place 1 spray into both nostrils daily.    [provider]  VITAMIN D PO Take 1 tablet by mouth daily.    [provider]    Allergies    Hydrocodone, Oxycodone, and Cinnamon  Review of Systems   Review of Systems  Constitutional:  Negative for chills and fever.  HENT:  Negative for congestion.   Eyes:  Negative for pain.  Respiratory:  Negative for cough and shortness of breath.   Cardiovascular:  Negative for chest pain and leg swelling.  Gastrointestinal:  Negative for abdominal pain, diarrhea, nausea and vomiting.  Genitourinary:  Negative for dysuria.  Musculoskeletal:  Positive for back pain. Negative for myalgias.  Skin:  Negative for rash.  Neurological:  Negative for dizziness and headaches.  Psychiatric/Behavioral:  The patient is not nervous/anxious.    Physical Exam Updated Vital Signs BP (!) 157/93   Pulse 83   Temp 97.9 F (36.6 C) (Oral)   Resp 18   Ht 5\' 8"  (1.727 m)   Wt 86.2 kg   SpO2 94%   BMI 28.89 kg/m   Physical Exam Vitals and nursing note reviewed.  Constitutional:      General: He is not in acute distress. HENT:     Head: Normocephalic and atraumatic.     Nose: Nose normal.  Eyes:     General: No scleral icterus. Cardiovascular:     Rate and Rhythm: Normal rate and regular rhythm.     Pulses: Normal pulses.     Heart sounds: Normal heart sounds.  Pulmonary:     Effort: Pulmonary effort is normal. No respiratory distress.     Breath sounds: Normal breath sounds. No wheezing.  Abdominal:     Palpations: Abdomen is soft.     Tenderness: There is no abdominal tenderness.  Musculoskeletal:     Cervical back: Normal range of motion.     Right lower leg: No edema.     Left lower leg: No edema.     Comments: Diffuse  thoracic tenderness to palpation with some midline tenderness.  Multiple areas of muscular tenderness/trigger points primarily on the left side of the mid thoracic posterior thorax Remote surgical scars midline lumbar spine from spinal stimulator.  No redness or swelling or midline tenderness to palpation  Skin:    General: Skin is warm and dry.     Capillary Refill: Capillary refill takes less than 2 seconds.  Neurological:     Mental Status: He is alert. Mental status is at baseline.     Comments: Bilateral patellar reflexes brisk and symmetric Sensation intact in bilateral lower extremities  Psychiatric:        Mood and Affect: Mood normal.        Behavior: Behavior normal.    ED Results / Procedures / Treatments   Labs (all labs ordered are listed, but only abnormal results are displayed) Labs Reviewed  URINALYSIS, ROUTINE W REFLEX MICROSCOPIC    EKG None  Radiology No results found.  Procedures Procedures   Medications Ordered in ED Medications  morphine 4 MG/ML injection 4 mg (4 mg Intramuscular Given 12/13/20 2025)  ibuprofen (ADVIL) tablet 600 mg (600 mg Oral Given 12/13/20 2022)  predniSONE (DELTASONE) tablet 40 mg (40 mg Oral Given 12/13/20 2022)    ED Course  I have reviewed the triage vital signs and the nursing notes.  Pertinent labs & imaging results that were available during my care of the patient were reviewed by me and considered in my medical decision making (see chart for details).    MDM Rules/Calculators/A&P                          Patient here for atraumatic back pain.  He has a history of chronic back pain.  Did bend over and straighten up around 930 this morning and had sudden onset of midthoracic back pain.  On my examination he has diffuse tenderness palpation of the posterior thorax.  Appears quite uncomfortable.  Sensation  intact in all extremities and patellar reflexes intact bilaterally  Broad differential considered disc herniation  thought to be most likely.  This versus muscular strain.  This was an atraumatic incident low suspicion for fractures.  History without symptoms of urinary or stool retention or incontinence, neurologic changes such as sensation change or weakness lower extremities, coagulopathy or blood thinner use, is not elderly or with history of osteoporosis, denies any history of cancer, fever, IV drug use, weight changes (unexplained), or prolonged steroid use.   Physical exam most consistent with muscular strain versus disc herniation. Doubt cauda equina or critical/impinging disc herniation d/t lack of saddle anesthesia/bowel or bladder incontinence or urinary retention, normal gait and reassuring physical examination without neurologic deficits.   History is not supportive of kidney stone, AAA, AD, pancreatitis, PE or PTX. Patient has no CVA tenderness or urinary sx to suggest pyelonephritis or kidney stone.   Will manage patient conservatively at this time. NSAIDs, back exercises/stretches, heat therapy and follow up with PCP if symptoms do not resolve in 3-4 weeks. Patient offered muscle relaxer for comfort at night. Counseled on need to return to ED for fever, worsening or concerning symptoms. Patient agreeable to plan and states understanding of follow up plans and return precautions.    Patient provided prednisone, morphine injection and ibuprofen.  On my reassessment patient feels significantly improved.  Will discharge home with prednisone and muscle relaxer.  Final Clinical Impression(s) / ED Diagnoses Final diagnoses:  Acute thoracic back pain, unspecified back pain laterality    Rx / DC Orders ED Discharge Orders          Ordered    methocarbamol (ROBAXIN) 500 MG tablet  2 times daily        12/13/20 2132    predniSONE (STERAPRED UNI-PAK 21 TAB) 10 MG (21) TBPK tablet  Daily        12/13/20 2132             Gailen Shelter, [place] 12/14/20 1732    Terrilee Files,  MD 12/15/20 1125

## 2020-12-26 ENCOUNTER — Other Ambulatory Visit: Payer: Self-pay

## 2020-12-26 ENCOUNTER — Other Ambulatory Visit: Payer: Self-pay | Admitting: Pain Medicine

## 2020-12-26 ENCOUNTER — Ambulatory Visit
Admission: RE | Admit: 2020-12-26 | Discharge: 2020-12-26 | Disposition: A | Discharge status: Home / Self Care | Payer: Medicare Other | Source: Ambulatory Visit | Attending: Pain Medicine | Admitting: Pain Medicine

## 2020-12-26 DIAGNOSIS — M549 Dorsalgia, unspecified: Secondary | ICD-10-CM

## 2021-01-02 ENCOUNTER — Other Ambulatory Visit: Payer: Self-pay | Admitting: Pain Medicine

## 2021-01-02 DIAGNOSIS — M549 Dorsalgia, unspecified: Secondary | ICD-10-CM

## 2021-01-13 ENCOUNTER — Inpatient Hospital Stay: Admission: RE | Admit: 2021-01-13 | Payer: Medicare Other | Source: Ambulatory Visit

## 2021-01-13 ENCOUNTER — Other Ambulatory Visit: Payer: Medicare Other

## 2021-07-16 ENCOUNTER — Ambulatory Visit
Admission: EM | Admit: 2021-07-16 | Discharge: 2021-07-16 | Disposition: A | Discharge status: Home / Self Care | Payer: Medicare HMO | Attending: Internal Medicine | Admitting: Internal Medicine

## 2021-07-16 DIAGNOSIS — H9203 Otalgia, bilateral: Secondary | ICD-10-CM | POA: Diagnosis present

## 2021-07-16 DIAGNOSIS — J3489 Other specified disorders of nose and nasal sinuses: Secondary | ICD-10-CM | POA: Diagnosis present

## 2021-07-16 DIAGNOSIS — J029 Acute pharyngitis, unspecified: Secondary | ICD-10-CM | POA: Diagnosis present

## 2021-07-16 LAB — POCT RAPID STREP A (OFFICE): Rapid Strep A Screen: NEGATIVE

## 2021-07-16 MED ORDER — AMOXICILLIN 500 MG PO CAPS: 500.0000 mg | ORAL_CAPSULE | Freq: Two times a day (BID) | ORAL | 0 refills | Status: AC

## 2021-07-16 NOTE — ED Triage Notes (Signed)
Pt c/o nasal drainage and ear pressure

## 2021-07-16 NOTE — ED Provider Notes (Signed)
[facility]    CSN: [id number] Arrival date & time: 07/16/21  1600      History   Chief Complaint Chief Complaint  Patient presents with   nasal drainage    HPI [name] [name] is a 41 y.o. male.   Patient presents with nasal drainage, nasal congestion, sore throat, bilateral ear discomfort that has been present for a few days.  Denies any known fevers.  His family members have had similar symptoms recently.  Patient has taken over-the-counter cold and flu medication with minimal improvement of symptoms.  Denies cough, chest pain, shortness of breath, nausea, vomiting, diarrhea, abdominal pain.    Past Medical History:  Diagnosis Date   Back pain     Patient Active Problem List   Diagnosis Date Noted   S/P debridement 04/03/2019   Contact with powered saw as cause of accidental injury 04/03/2019    Past Surgical History:  Procedure Laterality Date   AMPUTATION FINGER Left 04/03/2019   Procedure: Revision Amputation Small Finger;  Surgeon: Ernest Mallickreighton, James J III, MD;  Location: [facility] OR;  Service: Orthopedics;  Laterality: Left;   APPENDECTOMY     FLEXOR TENDON REPAIR Left 04/03/2019   Procedure: Flexor Tendon Repair Left Ring Finger;  Surgeon: Ernest Mallickreighton, James J III, MD;  Location: [facility] OR;  Service: Orthopedics;  Laterality: Left;   HAND SURGERY     I & D EXTREMITY Left 04/03/2019   Procedure: IRRIGATION AND DEBRIDEMENT open fracture;  Surgeon: Ernest Mallickreighton, James J III, MD;  Location: [facility] OR;  Service: Orthopedics;  Laterality: Left;   MINOR OPEN REDUCTION INTERNAL FIXATION (ORIF) FINGER Left 04/03/2019   Procedure: Minor Open Reduction Internal Fixation (Orif) Middle Finger;  Surgeon: Ernest Mallickreighton, James J III, MD;  Location: [facility] OR;  Service: Orthopedics;  Laterality: Left;   OPEN REDUCTION INTERNAL FIXATION (ORIF) METACARPAL Left 04/03/2019   Procedure: Index Finger Metacarpal Open Reduction Internal Fixation (Orif);  Surgeon: Ernest Mallickreighton, James J III, MD;  [facility];  Service: Orthopedics;  Laterality: Left;   RADIOFREQUENCY ABLATION NERVES     REPAIR EXTENSOR TENDON Left 04/03/2019   Procedure: Repair Extensor Tendon of index, middle and ring fingers;  Surgeon: Ernest Mallickreighton, James J III, MD;  Location: [facility] OR;  Service: Orthopedics;  Laterality: Left;   sinusectomy     SPINAL CORD STIMULATOR INSERTION     ULNAR COLLATERAL LIGAMENT REPAIR Left 04/03/2019   Procedure: Ulnar and Radial Collateral Ligament Repair to MP;  Surgeon: Ernest Mallickreighton, James J III, MD;  [facility];  Service: Orthopedics;  Laterality: Left;   VASCULAR SURGERY         Home Medications    Prior to Admission medications   Medication Sig Start Date End Date Taking? Authorizing Provider  amoxicillin (AMOXIL) 500 MG capsule Take 1 capsule (500 mg total) by mouth 2 (two) times daily for 10 days. 07/16/21 07/26/21 Yes Chanell Nadeau, Acie FredricksonHaley E, FNP  baclofen (LIORESAL) 10 MG tablet Take 5 mg by mouth at bedtime. 01/30/19   [provider]  butalbital-acetaminophen-caffeine (FIORICET, ESGIC) 50-325-40 MG tablet Take 1-2 tablets by mouth every 6 (six) hours as needed for headache. Patient not taking: Reported on 04/03/2019 04/25/17   Robinson, [place] N, PA-C  diphenhydrAMINE (BENADRYL) 25 MG tablet Take 25 mg by mouth every 6 (six) hours as needed for itching.    [provider]  docusate sodium (COLACE) 100 MG capsule Take 100 mg by mouth daily as needed for mild constipation.    [provider]  DULoxetine (CYMBALTA) 60 MG capsule Take 120 mg by mouth daily. 03/15/19   [provider]  eletriptan (RELPAX) 40 MG tablet Take 40 mg by mouth every 2 (two) hours as needed for migraine or headache. May repeat in 2 hours if headache persists or recurs.    [provider]  gabapentin (NEURONTIN) 600 MG tablet Take 300 mg by mouth 3 (three) times daily. 03/03/19   [provider]  HYDROcodone-acetaminophen (NORCO/VICODIN) 5-325 MG tablet Take 1-2 tablets by  mouth every 6 (six) hours as needed for moderate pain. Patient not taking: Reported on 04/25/2017 08/13/16   Cathren Laine, MD  methocarbamol (ROBAXIN) 500 MG tablet Take 1 tablet (500 mg total) by mouth 2 (two) times daily. 12/13/20   Gailen Shelter, PA  omeprazole (PRILOSEC) 20 MG capsule Take 40 mg by mouth daily.    [provider]  ondansetron (ZOFRAN) 4 MG tablet Take 1 tablet (4 mg total) by mouth every 6 (six) hours as needed for nausea. 04/04/19   [name], MD  oxymetazoline (AFRIN) 0.05 % nasal spray Place 1 spray into both nostrils daily.     [provider]  polyethylene glycol (MIRALAX / GLYCOLAX) packet Take 17 g by mouth every other day.     [provider]  predniSONE (STERAPRED UNI-PAK 21 TAB) 10 MG (21) TBPK tablet Take by mouth daily. Take 6 tabs by mouth daily  for 2 days, then 5 tabs for 2 days, then 4 tabs for 2 days, then 3 tabs for 2 days, 2 tabs for 2 days, then 1 tab by mouth daily for 2 days 12/13/20   Solon Augusta S, PA  sodium chloride (OCEAN) 0.65 % SOLN nasal spray Place 1 spray into both nostrils daily.    [provider]  SUMAtriptan Succinate (ONZETRA XSAIL) 11 MG/NOSEPC EXHP Place 1 spray into both nostrils daily as needed (migraine).    [provider]  traMADol (ULTRAM) 50 MG tablet Take 50 mg by mouth 3 (three) times daily.  03/19/17   [provider]  traZODone (DESYREL) 50 MG tablet Take 50 mg by mouth at bedtime. 02/13/19   [provider]  Triamcinolone Acetonide (NASACORT ALLERGY 24HR NA) Place 1 spray into both nostrils daily.    [provider]  VITAMIN D PO Take 1 tablet by mouth daily.    [provider]    Family History History reviewed. No pertinent family history.  Social History Social History   Tobacco Use   Smoking status: Former    Types: Cigarettes   Smokeless tobacco: Never  Vaping Use   Vaping Use: Never used  Substance Use Topics    Alcohol use: No   Drug use: No     Allergies   Hydrocodone, Oxycodone, and Cinnamon   Review of Systems Review of Systems Per HPI  Physical Exam Triage Vital Signs ED Triage Vitals [07/16/21 1630]  Enc Vitals Group     BP 120/88     Pulse Rate 88     Resp 18     Temp 98.6 F (37 C)     Temp Source Oral     SpO2 98 %     Weight      Height      Head Circumference      Peak Flow      Pain Score 0     Pain Loc      Pain Edu?  Excl. in GC?    No data found.  Updated Vital Signs BP 120/88 (BP Location: Left Arm)   Pulse 88   Temp 98.6 F (37 C) (Oral)   Resp 18   SpO2 98%   Visual Acuity Right Eye Distance:   Left Eye Distance:   Bilateral Distance:    Right Eye Near:   Left Eye Near:    Bilateral Near:     Physical Exam Constitutional:      General: He is not in acute distress.    Appearance: Normal appearance. He is not toxic-appearing or diaphoretic.  HENT:     Head: Normocephalic and atraumatic.     Right Ear: Tympanic membrane and ear canal normal.     Left Ear: Tympanic membrane and ear canal normal.     Nose: Congestion present.     Mouth/Throat:     Mouth: Mucous membranes are moist.     Pharynx: Oropharyngeal exudate and posterior oropharyngeal erythema present.     Tonsils: No tonsillar exudate or tonsillar abscesses.  Eyes:     Extraocular Movements: Extraocular movements intact.     Conjunctiva/sclera: Conjunctivae normal.     Pupils: Pupils are equal, round, and reactive to light.  Cardiovascular:     Rate and Rhythm: Normal rate and regular rhythm.     Pulses: Normal pulses.     Heart sounds: Normal heart sounds.  Pulmonary:     Effort: Pulmonary effort is normal. No respiratory distress.     Breath sounds: Normal breath sounds. No stridor. No wheezing, rhonchi or rales.  Abdominal:     General: Abdomen is flat. Bowel sounds are normal.     Palpations: Abdomen is soft.  Musculoskeletal:        General: Normal range of  motion.     Cervical back: Normal range of motion.  Skin:    General: Skin is warm and dry.  Neurological:     General: No focal deficit present.     Mental Status: He is alert and oriented to person, place, and time. Mental status is at baseline.  Psychiatric:        Mood and Affect: Mood normal.        Behavior: Behavior normal.     UC Treatments / Results  Labs (all labs ordered are listed, but only abnormal results are displayed) Labs Reviewed  CULTURE, GROUP A STREP [facility])  POCT RAPID STREP A (OFFICE)    EKG   Radiology No results found.  Procedures Procedures (including critical care time)  Medications Ordered in UC Medications - No data to display  Initial Impression / Assessment and Plan / UC Course  I have reviewed the triage vital signs and the nursing notes.  Pertinent labs & imaging results that were available during my care of the patient were reviewed by me and considered in my medical decision making (see chart for details).     Rapid strep is negative but still suspicious of strep throat given appearance of posterior pharynx on exam.  Will opt to treat with amoxicillin antibiotic.  Throat culture is pending.  Patient offered COVID testing to rule out viral cause of symptoms but declined.  Discussed supportive care and symptom management.  No signs of peritonsillar abscess on exam.  Discussed return precautions.  Patient verbalized understanding and was agreeable with plan. Final Clinical Impressions(s) / UC Diagnoses   Final diagnoses:  Sore throat  Acute ear pain, bilateral  Nasal drainage  Discharge Instructions      Rapid strep is negative.  Throat culture is pending.  We will call if it is positive.  I am still suspicious of strep throat given appearance of your throat on exam  so you are being treated with antibiotic.  This should help with your ear discomfort as well.  Please follow-up if symptoms persist or worsen.    ED Prescriptions      Medication Sig Dispense Auth. Provider   amoxicillin (AMOXIL) 500 MG capsule Take 1 capsule (500 mg total) by mouth 2 (two) times daily for 10 days. 20 capsule Gustavus Bryant, [place]      PDMP not reviewed this encounter.   Gustavus Bryant, [place] 07/16/21 1715

## 2021-07-16 NOTE — Discharge Instructions (Addendum)
Rapid strep is negative.  Throat culture is pending.  We will call if it is positive.  I am still suspicious of strep throat given appearance of your throat on exam  so you are being treated with antibiotic.  This should help with your ear discomfort as well.  Please follow-up if symptoms persist or worsen.

## 2021-07-19 LAB — CULTURE, GROUP A STREP (THRC)

## 2022-03-13 ENCOUNTER — Ambulatory Visit (INDEPENDENT_AMBULATORY_CARE_PROVIDER_SITE_OTHER): Payer: Medicare HMO

## 2022-03-13 ENCOUNTER — Ambulatory Visit: Payer: Medicare HMO | Admitting: Podiatry

## 2022-03-13 ENCOUNTER — Encounter: Payer: Self-pay | Admitting: Podiatry

## 2022-03-13 DIAGNOSIS — M722 Plantar fascial fibromatosis: Secondary | ICD-10-CM

## 2022-03-13 MED ORDER — TRIAMCINOLONE ACETONIDE 10 MG/ML IJ SUSP
20.0000 mg | Freq: Once | INTRAMUSCULAR | Status: AC
  Administered 2022-03-13: 20 mg

## 2022-03-13 NOTE — Progress Notes (Signed)
Subjective:   Patient ID: [name] [name], male   DOB: 42 y.o.   MRN: [id number]   HPI Patient presents with severe heel pain right over left and states that it has been very sore and making walking difficult.  Been going on around 8 months with patient having significant history of back issues does not smoke currently tries to be active but difficult because of the problems with the back   Review of Systems  All other systems reviewed and are negative.       Objective:  Physical Exam Vitals and nursing note reviewed.  Constitutional:      Appearance: He is well-developed.  Pulmonary:     Effort: Pulmonary effort is normal.  Musculoskeletal:        General: Normal range of motion.  Skin:    General: Skin is warm.  Neurological:     Mental Status: He is alert.     Vascular status intact neurological is moderately diminished left side secondary to back issues with probable nerve compression with range of motion adequate muscle strength moderately diminished.  Patient has severe discomfort medial fascial band clinical right over left inflammation fluid in the medial border and swelling noted.  Good digital perfusion well-oriented     Assessment:  Acute Planter fasciitis right over left inflammation fluid of the medial band     Plan:  H&P reviewed condition and x-rays and also discussed the back issues which may be contributory towards this or also because of change in gait because of back issues that may be contributory.  Went ahead today did sterile prep and injected the medial fascial band bilateral 3 mg Kenalog 5 mg Xylocaine and applied fascial brace left.  Instructed on supportive shoes and physical therapy and reappoint in 3 weeks  X-rays were negative for signs of spur related overall structure no indication stress fracture or arthritis

## 2022-04-03 ENCOUNTER — Encounter: Payer: Self-pay | Admitting: Podiatry

## 2022-04-03 ENCOUNTER — Ambulatory Visit: Payer: Medicare HMO | Admitting: Podiatry

## 2022-04-03 DIAGNOSIS — M722 Plantar fascial fibromatosis: Secondary | ICD-10-CM | POA: Diagnosis not present

## 2022-04-03 NOTE — Progress Notes (Signed)
Subjective:   Patient ID: [name] [name], male   DOB: 42 y.o.   MRN: [id number]   HPI Patient states significantly improved with his heel pain right states still mildly tender but much better   ROS      Objective:  Physical Exam  Neuro vascular status intact significant improvement right plantar fascia at the insertional point tendon calcaneus mild discomfort only upon deep palpation     Assessment:  Acute Planter fasciitis improved doing much better with treatment     Plan:  H&P reviewed and recommend continue physical therapy anti-inflammatory support patient's discharge reappoint as needed

## 2022-07-06 ENCOUNTER — Encounter: Payer: Self-pay | Admitting: Podiatry

## 2022-07-06 ENCOUNTER — Ambulatory Visit: Payer: Medicare HMO | Admitting: Podiatry

## 2022-07-06 DIAGNOSIS — M722 Plantar fascial fibromatosis: Secondary | ICD-10-CM

## 2022-07-06 MED ORDER — TRIAMCINOLONE ACETONIDE 10 MG/ML IJ SUSP
10.0000 mg | Freq: Once | INTRAMUSCULAR | Status: AC
Start: 2022-07-06 — End: 2022-07-06
  Administered 2022-07-06: 10 mg

## 2022-07-06 NOTE — Progress Notes (Signed)
Subjective:   Patient ID: [name] [name], male   DOB: 42 y.o.   MRN: [id number]   HPI Patient states he was doing really well but is having reoccurrence of pain over the last few weeks and is worse when getting up the morning after periods of sitting   ROS      Objective:  Physical Exam  Neurovascular status intact exquisite discomfort medial fascial band right at the insertional point tendon calcaneus     Assessment:  Acute Planter fasciitis right with inflammation fluid buildup worse after periods of sitting when getting up in the morning     Plan:  Sterile prep of the plantar fascia right 3 mg Kenalog 5 mg Xylocaine dispensed night splint with all instructions on usage along with heat ice therapy and reappoint to recheck as symptoms indicate

## 2022-12-28 ENCOUNTER — Encounter: Payer: Self-pay | Admitting: Podiatry

## 2022-12-28 ENCOUNTER — Ambulatory Visit: Payer: Medicare HMO | Admitting: Podiatry

## 2022-12-28 DIAGNOSIS — M722 Plantar fascial fibromatosis: Secondary | ICD-10-CM

## 2022-12-28 MED ORDER — TRIAMCINOLONE ACETONIDE 10 MG/ML IJ SUSP
10.0000 mg | Freq: Once | INTRAMUSCULAR | Status: AC
Start: 2022-12-28 — End: 2022-12-28
  Administered 2022-12-28: 10 mg via INTRA_ARTICULAR

## 2022-12-28 NOTE — Progress Notes (Signed)
Subjective:   Patient ID: [name] [name], male   DOB: 42 y.o.   MRN: [id number]   HPI Patient states has developed a lot of pain again in his right heel over the last couple weeks and does not remember specific injury but he did change some shoe gear and has not been helpful and he does have back problems   ROS      Objective:  Physical Exam  Neurovascular status intact with acute discomfort medial fascial band right insertional point tendon calcaneus fluid buildup around the medial band     Assessment:  Acute plantar fasciitis right that is reformed     Plan:  Sterile prep reinjected the fascia at insertion 3 mg Kenalog 5 mg Xylocaine discussed shoe gear modifications reappoint as needed

## 2023-05-22 ENCOUNTER — Encounter (HOSPITAL_BASED_OUTPATIENT_CLINIC_OR_DEPARTMENT_OTHER): Payer: Self-pay

## 2023-05-22 ENCOUNTER — Emergency Department (HOSPITAL_BASED_OUTPATIENT_CLINIC_OR_DEPARTMENT_OTHER)

## 2023-05-22 ENCOUNTER — Emergency Department (HOSPITAL_BASED_OUTPATIENT_CLINIC_OR_DEPARTMENT_OTHER)
Admission: EM | Admit: 2023-05-22 | Discharge: 2023-05-23 | Disposition: A | Discharge status: Home / Self Care | Attending: Emergency Medicine | Admitting: Emergency Medicine

## 2023-05-22 DIAGNOSIS — R109 Unspecified abdominal pain: Secondary | ICD-10-CM | POA: Diagnosis present

## 2023-05-22 DIAGNOSIS — R1084 Generalized abdominal pain: Secondary | ICD-10-CM | POA: Insufficient documentation

## 2023-05-22 HISTORY — DX: Other chronic pain: G89.29

## 2023-05-22 HISTORY — DX: Unspecified asthma, uncomplicated: J45.909

## 2023-05-22 LAB — COMPREHENSIVE METABOLIC PANEL WITH GFR
ALT: 79 U/L — ABNORMAL HIGH (ref 0–44)
AST: 23 U/L (ref 15–41)
Albumin: 3.9 g/dL (ref 3.5–5.0)
Alkaline Phosphatase: 67 U/L (ref 38–126)
Anion gap: 12 (ref 5–15)
BUN: 21 mg/dL — ABNORMAL HIGH (ref 6–20)
CO2: 23 mmol/L (ref 22–32)
Calcium: 8.9 mg/dL (ref 8.9–10.3)
Chloride: 105 mmol/L (ref 98–111)
Creatinine, Ser: 0.98 mg/dL (ref 0.61–1.24)
GFR, Estimated: >60 mL/min (ref >60)
Glucose, Bld: 128 mg/dL — ABNORMAL HIGH (ref 70–99)
Potassium: 3.5 mmol/L (ref 3.5–5.1)
Sodium: 140 mmol/L (ref 135–145)
Total Bilirubin: 0.9 mg/dL (ref 0.0–1.2)
Total Protein: 7.7 g/dL (ref 6.5–8.1)

## 2023-05-22 LAB — URINALYSIS, ROUTINE W REFLEX MICROSCOPIC
Bilirubin Urine: NEGATIVE
Glucose, UA: NEGATIVE mg/dL
Hgb urine dipstick: NEGATIVE
Ketones, ur: 80 mg/dL — AB
Leukocytes,Ua: NEGATIVE
Nitrite: NEGATIVE
Protein, ur: NEGATIVE mg/dL
Specific Gravity, Urine: 1.02 (ref 1.005–1.030)
pH: 7 (ref 5.0–8.0)

## 2023-05-22 LAB — CBC
HCT: 49.8 % (ref 39.0–52.0)
Hemoglobin: 17.1 g/dL — ABNORMAL HIGH (ref 13.0–17.0)
MCH: 28.7 pg (ref 26.0–34.0)
MCHC: 34.3 g/dL (ref 30.0–36.0)
MCV: 83.7 fL (ref 80.0–100.0)
Platelets: 315 10*3/uL (ref 150–400)
RBC: 5.95 MIL/uL — ABNORMAL HIGH (ref 4.22–5.81)
RDW: 13.6 % (ref 11.5–15.5)
WBC: 9.6 10*3/uL (ref 4.0–10.5)
nRBC: 0 % (ref 0.0–0.2)

## 2023-05-22 LAB — LIPASE, BLOOD: Lipase: 30 U/L (ref 11–51)

## 2023-05-22 LAB — RESP PANEL BY RT-PCR (RSV, FLU A&B, COVID)  RVPGX2
Influenza A by PCR: NEGATIVE
Influenza B by PCR: NEGATIVE
Resp Syncytial Virus by PCR: NEGATIVE
SARS Coronavirus 2 by RT PCR: NEGATIVE

## 2023-05-22 MED ORDER — SODIUM CHLORIDE 0.9 % IV BOLUS
1000.0000 mL | Freq: Once | INTRAVENOUS | Status: AC
  Administered 2023-05-22: 1000 mL via INTRAVENOUS

## 2023-05-22 MED ORDER — DIPHENHYDRAMINE HCL 50 MG/ML IJ SOLN
25.0000 mg | Freq: Once | INTRAMUSCULAR | Status: AC
  Administered 2023-05-22: 25 mg via INTRAVENOUS
  Filled 2023-05-22: qty 1

## 2023-05-22 MED ORDER — ONDANSETRON 4 MG PO TBDP: ORAL_TABLET | ORAL | 0 refills | Status: AC

## 2023-05-22 MED ORDER — ACETAMINOPHEN 500 MG PO TABS
1000.0000 mg | ORAL_TABLET | Freq: Once | ORAL | Status: AC
  Administered 2023-05-22: 1000 mg via ORAL
  Filled 2023-05-22: qty 2

## 2023-05-22 MED ORDER — ONDANSETRON HCL 4 MG/2ML IJ SOLN
4.0000 mg | Freq: Once | INTRAMUSCULAR | Status: AC
  Administered 2023-05-22: 4 mg via INTRAVENOUS
  Filled 2023-05-22: qty 2

## 2023-05-22 MED ORDER — PROCHLORPERAZINE EDISYLATE 10 MG/2ML IJ SOLN
10.0000 mg | Freq: Once | INTRAMUSCULAR | Status: AC
  Administered 2023-05-22: 10 mg via INTRAVENOUS
  Filled 2023-05-22: qty 2

## 2023-05-22 MED ORDER — MORPHINE SULFATE (PF) 4 MG/ML IV SOLN
4.0000 mg | Freq: Once | INTRAVENOUS | Status: AC
  Administered 2023-05-22: 4 mg via INTRAVENOUS
  Filled 2023-05-22: qty 1

## 2023-05-22 MED ORDER — IOHEXOL 300 MG/ML  SOLN
100.0000 mL | Freq: Once | INTRAMUSCULAR | Status: AC | PRN
  Administered 2023-05-22: 100 mL via INTRAVENOUS

## 2023-05-22 NOTE — ED Provider Notes (Signed)
 [facility] Provider Note   CSN: [id number] Arrival date & time: 05/22/23  1554     History  Chief Complaint  Patient presents with   Abdominal Pain    [name] [name] is a 43 y.o. male.  Patient is a 43 year old male who presents with abdominal pain.  He states he woke up this morning about 3 AM with pain across his upper abdomen.  It goes through to his back.  He has associated nausea vomiting and diarrhea.  He says his emesis is green and nonbloody.  His diarrhea is watery and nonbloody.  He denies any known fevers.  He does have a little bit of burning on urination.  He has a prior history of cholecystectomy and appendectomy.       Home Medications Prior to Admission medications   Medication Sig Start Date End Date Taking? Authorizing Provider  ondansetron (ZOFRAN-ODT) 4 MG disintegrating tablet 4mg  ODT q4 hours prn nausea/vomit 05/22/23  Yes Rolan Bucco, MD  baclofen (LIORESAL) 10 MG tablet Take 5 mg by mouth at bedtime. 01/30/19   [provider]  butalbital-acetaminophen-caffeine (FIORICET, ESGIC) 50-325-40 MG tablet Take 1-2 tablets by mouth every 6 (six) hours as needed for headache. 04/25/17   Robinson, [place] N, PA-C  diphenhydrAMINE (BENADRYL) 25 MG tablet Take 25 mg by mouth every 6 (six) hours as needed for itching.    [provider]  docusate sodium (COLACE) 100 MG capsule Take 100 mg by mouth daily as needed for mild constipation.    [provider]  DULoxetine (CYMBALTA) 60 MG capsule Take 120 mg by mouth daily. 03/15/19   [provider]  eletriptan (RELPAX) 40 MG tablet Take 40 mg by mouth every 2 (two) hours as needed for migraine or headache. May repeat in 2 hours if headache persists or recurs.    [provider]  gabapentin (NEURONTIN) 600 MG tablet Take 300 mg by mouth 3 (three) times daily. 03/03/19   [provider]  HYDROcodone-acetaminophen (NORCO/VICODIN)  5-325 MG tablet Take 1-2 tablets by mouth every 6 (six) hours as needed for moderate pain. 08/13/16   Cathren Laine, MD  methocarbamol (ROBAXIN) 500 MG tablet Take 1 tablet (500 mg total) by mouth 2 (two) times daily. 12/13/20   Gailen Shelter, PA  omeprazole (PRILOSEC) 20 MG capsule Take 40 mg by mouth daily.    [provider]  ondansetron (ZOFRAN) 4 MG tablet Take 1 tablet (4 mg total) by mouth every 6 (six) hours as needed for nausea. 04/04/19   [name], MD  oxymetazoline (AFRIN) 0.05 % nasal spray Place 1 spray into both nostrils daily.    [provider]  polyethylene glycol (MIRALAX / GLYCOLAX) packet Take 17 g by mouth every other day.     [provider]  predniSONE (STERAPRED UNI-PAK 21 TAB) 10 MG (21) TBPK tablet Take by mouth daily. Take 6 tabs by mouth daily  for 2 days, then 5 tabs for 2 days, then 4 tabs for 2 days, then 3 tabs for 2 days, 2 tabs for 2 days, then 1 tab by mouth daily for 2 days 12/13/20   Gailen Shelter, PA  pregabalin (LYRICA) 50 MG capsule Take 50 mg by mouth 2 (two) times daily as needed.    [provider]  sodium chloride (OCEAN) 0.65 % SOLN nasal spray Place 1 spray into both nostrils daily.    [provider]  SUMAtriptan  Succinate (ONZETRA XSAIL) 11 MG/NOSEPC EXHP Place 1 spray into both nostrils daily as needed (migraine).    [provider]  traMADol (ULTRAM) 50 MG tablet Take 50 mg by mouth 3 (three) times daily. 03/19/17   [provider]  traZODone (DESYREL) 50 MG tablet Take 50 mg by mouth at bedtime. 02/13/19   [provider]  Triamcinolone Acetonide (NASACORT ALLERGY 24HR NA) Place 1 spray into both nostrils daily.    [provider]  VITAMIN D PO Take 1 tablet by mouth daily.    [provider]      Allergies    Hydrocodone, Oxycodone, and Cinnamon    Review of Systems   Review of Systems  Constitutional:  Negative for chills, diaphoresis,  fatigue and fever.  HENT:  Negative for congestion, rhinorrhea and sneezing.   Eyes: Negative.   Respiratory:  Negative for cough, chest tightness and shortness of breath.   Cardiovascular:  Negative for chest pain and leg swelling.  Gastrointestinal:  Positive for abdominal pain, diarrhea, nausea and vomiting. Negative for blood in stool.  Genitourinary:  Negative for difficulty urinating, flank pain, frequency and hematuria.  Musculoskeletal:  Negative for arthralgias and back pain.  Skin:  Negative for rash.  Neurological:  Negative for dizziness, speech difficulty, weakness, numbness and headaches.    Physical Exam Updated Vital Signs BP 131/89   Pulse (!) 110   Temp 98 F (36.7 C) (Oral)   Resp 18   Ht 5\' 8"  (1.727 m)   Wt 85.7 kg   SpO2 95%   BMI 28.74 kg/m  Physical Exam Constitutional:      Appearance: He is well-developed.     Comments: Appears uncomfortable  HENT:     Head: Normocephalic and atraumatic.  Eyes:     Pupils: Pupils are equal, round, and reactive to light.  Cardiovascular:     Rate and Rhythm: Normal rate and regular rhythm.     Heart sounds: Normal heart sounds.  Pulmonary:     Effort: Pulmonary effort is normal. No respiratory distress.     Breath sounds: Normal breath sounds. No wheezing or rales.  Chest:     Chest wall: No tenderness.  Abdominal:     General: Bowel sounds are normal.     Palpations: Abdomen is soft.     Tenderness: There is abdominal tenderness in the right upper quadrant, epigastric area and left upper quadrant. There is no guarding or rebound.  Musculoskeletal:        General: Normal range of motion.     Cervical back: Normal range of motion and neck supple.  Lymphadenopathy:     Cervical: No cervical adenopathy.  Skin:    General: Skin is warm and dry.     Findings: No rash.  Neurological:     Mental Status: He is alert and oriented to person, place, and time.     ED Results / Procedures / Treatments   Labs (all  labs ordered are listed, but only abnormal results are displayed) Labs Reviewed  COMPREHENSIVE METABOLIC PANEL WITH GFR - Abnormal; Notable for the following components:      Result Value   Glucose, Bld 128 (*)    BUN 21 (*)    ALT 79 (*)    All other components within normal limits  CBC - Abnormal; Notable for the following components:   RBC 5.95 (*)    Hemoglobin 17.1 (*)    All other components within normal limits  URINALYSIS, ROUTINE W REFLEX MICROSCOPIC - Abnormal; Notable for the following components:   Ketones, ur 80 (*)    All other components within normal limits  RESP PANEL BY RT-PCR (RSV, FLU A&B, COVID)  RVPGX2  LIPASE, BLOOD    EKG EKG Interpretation Date/Time:  Saturday May 22 2023 16:06:56 EDT Ventricular Rate:  113 PR Interval:  161 QRS Duration:  87 QT Interval:  314 QTC Calculation: 431 R Axis:   86  Text Interpretation: Sinus tachycardia SINCE LAST TRACING HEART RATE HAS INCREASED Confirmed by Rolan Bucco [phone]) on 05/22/2023 4:30:17 PM  Radiology CT ABDOMEN PELVIS W CONTRAST Result Date: 05/22/2023 CLINICAL DATA:  Abdominal pain, acute, nonlocalized Epigastric pain with nausea, vomiting and diarrhea since this morning. Concern for pancreatitis. Previous cholecystectomy and appendectomy. EXAM: CT ABDOMEN AND PELVIS WITH CONTRAST TECHNIQUE: Multidetector CT imaging of the abdomen and pelvis was performed using the standard protocol following bolus administration of intravenous contrast. RADIATION DOSE REDUCTION: This exam was performed according to the departmental dose-optimization program which includes automated exposure control, adjustment of the mA and/or kV according to patient size and/or use of iterative reconstruction technique. CONTRAST:  OMNIPAQUE IOHEXOL 300 MG/ML  SOLN COMPARISON:  Abdominal pelvic CT 08/19/2018. FINDINGS: Lower chest: Mild dependent atelectasis in both lungs. No significant pleural or pericardial effusion. Hepatobiliary:  The liver is normal in density without suspicious focal abnormality. No significant biliary dilatation status post cholecystectomy. Pancreas: Unremarkable. No pancreatic ductal dilatation or surrounding inflammatory changes. Spleen: Normal in size without focal abnormality. Adrenals/Urinary Tract: Both adrenal glands appear normal. No evidence of urinary tract calculus, suspicious renal lesion or hydronephrosis. The bladder appears unremarkable for its degree of distention. Stomach/Bowel: No enteric contrast administered. The stomach appears unremarkable for its degree of distension. No evidence of bowel wall thickening, distention or surrounding inflammatory change. Postsurgical changes adjacent to the cecal tip consistent with prior appendectomy. The colon is decompressed and fluid-filled, without surrounding inflammation. Vascular/Lymphatic: There are no enlarged abdominal or pelvic lymph nodes. No significant vascular findings. Reproductive: Stable prostatic calcifications. Other: No evidence of abdominal wall mass or hernia. No ascites or pneumoperitoneum. Musculoskeletal: No acute or significant osseous findings. Interval revision of previously demonstrated thoracic spinal stimulator. IMPRESSION: 1. No acute findings or explanation for the patient's symptoms. 2. The colon is decompressed and fluid-filled, consistent with diarrheal illness. 3. Previous cholecystectomy and appendectomy. Electronically Signed   By: Carey Bullocks M.D.   On: 05/22/2023 17:30    Procedures Procedures    Medications Ordered in ED Medications  morphine (PF) 4 MG/ML injection 4 mg (4 mg Intravenous Given 05/22/23 1637)  sodium chloride 0.9 % bolus 1,000 mL (0 mLs Intravenous Stopped 05/22/23 1755)  ondansetron (ZOFRAN) injection 4 mg (4 mg Intravenous Given 05/22/23 1637)  iohexol (OMNIPAQUE) 300 MG/ML solution 100 mL (100 mLs Intravenous Contrast Given 05/22/23 1713)  sodium chloride 0.9 % bolus 1,000 mL (0 mLs Intravenous  Stopped 05/22/23 2010)  sodium chloride 0.9 % bolus 1,000 mL (1,000 mLs Intravenous New Bag/Given 05/22/23 2018)  acetaminophen (TYLENOL) tablet 1,000 mg (1,000 mg Oral Given 05/22/23 2051)  prochlorperazine (COMPAZINE) injection 10 mg (10 mg Intravenous Given 05/22/23 2051)  diphenhydrAMINE (BENADRYL) injection 25 mg (25 mg Intravenous Given 05/22/23 2111)    ED Course/ Medical Decision Making/ A&P                                 Medical Decision Making Amount  and/or Complexity of Data Reviewed Labs: ordered. Radiology: ordered.  Risk OTC drugs. Prescription drug management.   Patient is a 44 year old who presents with abdominal pain.  Labs are reviewed and are nonconcerning.  His ALT was mildly elevated.  CT scan does not show any acute abnormality.  No evidence of pancreatitis, colitis or bowel obstruction.  He is status post cholecystectomy and appendectomy.  He does have some CT findings consistent with likely viral enteritis.  He was given IV fluids and pain control medications.  He remained tachycardic.  He then was complaining of a migrainous type headache.  He thinks it is from not having caffeine all day.  He was given dose of Compazine and had an agitated reaction to the Compazine.  Was given dose of Benadryl and he felt better after that.  His heart rate actually went down into the 90s but when he got up and moved around it would go back up to the 110s.  However he was able to drink fluids without any ongoing symptoms.  Given his ongoing tachycardia, I did discuss admission for observation but he adamantly is ready to go home.  He says he has not taken any of his chronic pain medications and is anxious and feels like it is more related to that.  He was discharged home in good condition.  Was encouraged to follow-up with his primary care doctor for reevaluation.  Return precautions were given.  Final Clinical Impression(s) / ED Diagnoses Final diagnoses:  Generalized abdominal pain     Rx / DC Orders ED Discharge Orders          Ordered    ondansetron (ZOFRAN-ODT) 4 MG disintegrating tablet        05/22/23 2351              Rolan Bucco, MD 05/22/23 2352

## 2023-05-22 NOTE — ED Triage Notes (Signed)
 C/o epigastric pain since 0300 with N/V/D. Sent here by urgent care to r/o pancreatitis. Hx of cholecystectomy & appendectomy.  4.5weeks ago had a spinal stimulator inserted.

## 2023-05-22 NOTE — ED Notes (Signed)
 Pt provided with ice water at this time.

## 2023-05-22 NOTE — ED Notes (Signed)
 Pt given a ice pack and 2 wash rags soaked in ice water

## 2023-05-22 NOTE — ED Notes (Signed)
 Pt notified of need for urine sample. Pt given urinal at this time.

## 2023-07-02 ENCOUNTER — Other Ambulatory Visit: Payer: Self-pay | Admitting: Pain Medicine

## 2023-07-02 DIAGNOSIS — M792 Neuralgia and neuritis, unspecified: Secondary | ICD-10-CM

## 2023-07-18 ENCOUNTER — Other Ambulatory Visit: Payer: Self-pay

## 2023-07-18 ENCOUNTER — Emergency Department (HOSPITAL_COMMUNITY)
Admission: EM | Admit: 2023-07-18 | Discharge: 2023-07-18 | Disposition: A | Discharge status: Home / Self Care | Attending: Emergency Medicine | Admitting: Emergency Medicine

## 2023-07-18 ENCOUNTER — Emergency Department (HOSPITAL_COMMUNITY)

## 2023-07-18 ENCOUNTER — Encounter (HOSPITAL_COMMUNITY): Payer: Self-pay

## 2023-07-18 DIAGNOSIS — R079 Chest pain, unspecified: Secondary | ICD-10-CM | POA: Diagnosis present

## 2023-07-18 DIAGNOSIS — R0789 Other chest pain: Secondary | ICD-10-CM | POA: Diagnosis not present

## 2023-07-18 DIAGNOSIS — R062 Wheezing: Secondary | ICD-10-CM | POA: Insufficient documentation

## 2023-07-18 LAB — TROPONIN I (HIGH SENSITIVITY)
Troponin I (High Sensitivity): 4 ng/L (ref <18)
Troponin I (High Sensitivity): 4 ng/L (ref <18)

## 2023-07-18 LAB — CBC
HCT: 47.6 % (ref 39.0–52.0)
Hemoglobin: 15.8 g/dL (ref 13.0–17.0)
MCH: 28.7 pg (ref 26.0–34.0)
MCHC: 33.2 g/dL (ref 30.0–36.0)
MCV: 86.5 fL (ref 80.0–100.0)
Platelets: 355 10*3/uL (ref 150–400)
RBC: 5.5 MIL/uL (ref 4.22–5.81)
RDW: 13.2 % (ref 11.5–15.5)
WBC: 8.4 10*3/uL (ref 4.0–10.5)
nRBC: 0 % (ref 0.0–0.2)

## 2023-07-18 LAB — D-DIMER, QUANTITATIVE: D-Dimer, Quant: <0.27 ug{FEU}/mL (ref 0.00–0.50)

## 2023-07-18 LAB — BASIC METABOLIC PANEL WITH GFR
Anion gap: 11 (ref 5–15)
BUN: 9 mg/dL (ref 6–20)
CO2: 25 mmol/L (ref 22–32)
Calcium: 9.4 mg/dL (ref 8.9–10.3)
Chloride: 105 mmol/L (ref 98–111)
Creatinine, Ser: 0.95 mg/dL (ref 0.61–1.24)
GFR, Estimated: >60 mL/min (ref >60)
Glucose, Bld: 80 mg/dL (ref 70–99)
Potassium: 3.3 mmol/L — ABNORMAL LOW (ref 3.5–5.1)
Sodium: 141 mmol/L (ref 135–145)

## 2023-07-18 MED ORDER — IPRATROPIUM-ALBUTEROL 0.5-2.5 (3) MG/3ML IN SOLN
3.0000 mL | Freq: Once | RESPIRATORY_TRACT | Status: AC
  Administered 2023-07-18: 3 mL via RESPIRATORY_TRACT
  Filled 2023-07-18: qty 3

## 2023-07-18 MED ORDER — SODIUM CHLORIDE 0.9 % IV BOLUS
1000.0000 mL | Freq: Once | INTRAVENOUS | Status: AC
  Administered 2023-07-18: 1000 mL via INTRAVENOUS

## 2023-07-18 MED ORDER — KETOROLAC TROMETHAMINE 30 MG/ML IJ SOLN
30.0000 mg | Freq: Once | INTRAMUSCULAR | Status: AC
  Administered 2023-07-18: 30 mg via INTRAVENOUS
  Filled 2023-07-18: qty 1

## 2023-07-18 NOTE — ED Triage Notes (Signed)
 This morning pt woke up with nausea and migraine with aura. Took migraine meds and pt began having central chest tightness and SOB that radiated to left arm and neck. Pain was heavy but also was having sharp chest pain. Pt bp was elevated while at home. Pain is worse with movement. Denies cardiac history

## 2023-07-18 NOTE — ED Provider Notes (Signed)
 [place] [facility] AT [facility] Provider Note   CSN: [id number] Arrival date & time: 07/18/23  1914     History  Chief Complaint  Patient presents with   Chest Pain    [name] [name] is a 43 y.o. male.  Pt is a 43 yo male with pmhx significant for asthma, migraines, and chronic pain.  Pt was just switched to a different migraine med (sumatriptan) because his insurance would not pay for his other one he was taking.  Pt said he took that medication and then developed cp and sob.  Cp worse with movement.  Pt said the pain has improved, but it is still there.  Pt was also recently diagnosed with a sinus infection and asthma exac.  He's been on Augmentin and prednisone .  Cough has improved, but is still there.  Migraine is better.        Home Medications Prior to Admission medications   Medication Sig Start Date End Date Taking? Authorizing Provider  baclofen  (LIORESAL ) 10 MG tablet Take 5 mg by mouth at bedtime. 01/30/19   [provider]  butalbital -acetaminophen -caffeine  (FIORICET, ESGIC) 50-325-40 MG tablet Take 1-2 tablets by mouth every 6 (six) hours as needed for headache. 04/25/17   Robinson, Jordan N, PA-C  diphenhydrAMINE  (BENADRYL ) 25 MG tablet Take 25 mg by mouth every 6 (six) hours as needed for itching.    [provider]  docusate sodium  (COLACE) 100 MG capsule Take 100 mg by mouth daily as needed for mild constipation.    [provider]  DULoxetine  (CYMBALTA ) 60 MG capsule Take 120 mg by mouth daily. 03/15/19   [provider]  eletriptan (RELPAX) 40 MG tablet Take 40 mg by mouth every 2 (two) hours as needed for migraine or headache. May repeat in 2 hours if headache persists or recurs.    [provider]  gabapentin  (NEURONTIN ) 600 MG tablet Take 300 mg by mouth 3 (three) times daily. 03/03/19   [provider]  HYDROcodone -acetaminophen  (NORCO/VICODIN) 5-325 MG tablet Take 1-2 tablets by mouth  every 6 (six) hours as needed for moderate pain. 08/13/16   Steinl, Kevin, MD  methocarbamol  (ROBAXIN ) 500 MG tablet Take 1 tablet (500 mg total) by mouth 2 (two) times daily. 12/13/20   Coretta Dexter, PA  omeprazole (PRILOSEC) 20 MG capsule Take 40 mg by mouth daily.    [provider]  ondansetron  (ZOFRAN ) 4 MG tablet Take 1 tablet (4 mg total) by mouth every 6 (six) hours as needed for nausea. 04/04/19   Elbert Greaves III, MD  ondansetron  (ZOFRAN -ODT) 4 MG disintegrating tablet 4mg  ODT q4 hours prn nausea/vomit 05/22/23   Hershel Los, MD  oxymetazoline (AFRIN) 0.05 % nasal spray Place 1 spray into both nostrils daily.    [provider]  polyethylene glycol (MIRALAX / GLYCOLAX) packet Take 17 g by mouth every other day.     [provider]  predniSONE  (STERAPRED UNI-PAK 21 TAB) 10 MG (21) TBPK tablet Take by mouth daily. Take 6 tabs by mouth daily  for 2 days, then 5 tabs for 2 days, then 4 tabs for 2 days, then 3 tabs for 2 days, 2 tabs for 2 days, then 1 tab by mouth daily for 2 days 12/13/20   Coretta Dexter, PA  pregabalin (LYRICA) 50 MG capsule Take 50 mg by mouth 2 (two) times daily as needed.    [provider]  sodium chloride  (OCEAN) 0.65 % SOLN  nasal spray Place 1 spray into both nostrils daily.    [provider]  SUMAtriptan Succinate (ONZETRA XSAIL) 11 MG/NOSEPC EXHP Place 1 spray into both nostrils daily as needed (migraine).    [provider]  traMADol (ULTRAM) 50 MG tablet Take 50 mg by mouth 3 (three) times daily. 03/19/17   [provider]  traZODone  (DESYREL ) 50 MG tablet Take 50 mg by mouth at bedtime. 02/13/19   [provider]  Triamcinolone  Acetonide (NASACORT  ALLERGY 24HR NA) Place 1 spray into both nostrils daily.    [provider]  VITAMIN D PO Take 1 tablet by mouth daily.    [provider]      Allergies    Hydrocodone , Oxycodone , Cinnamon, and Compazine   [prochlorperazine ]    Review of Systems   Review of Systems  Respiratory:  Positive for cough and shortness of breath.   Cardiovascular:  Positive for chest pain.  All other systems reviewed and are negative.   Physical Exam Updated Vital Signs BP 133/87   Pulse 68   Temp 98.6 F (37 C)   Resp 14   Ht 5\' 8"  (1.727 m)   Wt 86.2 kg   SpO2 100%   BMI 28.89 kg/m  Physical Exam Vitals and nursing note reviewed.  Constitutional:      Appearance: He is well-developed.  HENT:     Head: Normocephalic and atraumatic.  Eyes:     Extraocular Movements: Extraocular movements intact.     Pupils: Pupils are equal, round, and reactive to light.  Cardiovascular:     Rate and Rhythm: Normal rate and regular rhythm.     Heart sounds: Normal heart sounds.  Pulmonary:     Effort: Pulmonary effort is normal.     Breath sounds: Wheezing present.  Abdominal:     General: Bowel sounds are normal.     Palpations: Abdomen is soft.  Musculoskeletal:        General: Normal range of motion.     Cervical back: Normal range of motion and neck supple.  Skin:    General: Skin is warm and dry.     Capillary Refill: Capillary refill takes less than 2 seconds.  Neurological:     General: No focal deficit present.     Mental Status: He is alert and oriented to person, place, and time.  Psychiatric:        Mood and Affect: Mood normal.        Behavior: Behavior normal.     ED Results / Procedures / Treatments   Labs (all labs ordered are listed, but only abnormal results are displayed) Labs Reviewed  BASIC METABOLIC PANEL WITH GFR - Abnormal; Notable for the following components:      Result Value   Potassium 3.3 (*)    All other components within normal limits  CBC  D-DIMER, QUANTITATIVE  TROPONIN I (HIGH SENSITIVITY)  TROPONIN I (HIGH SENSITIVITY)    EKG EKG Interpretation Date/Time:  Sunday Jul 18 2023 08:54:09 EDT Ventricular Rate:  73 PR Interval:  166 QRS Duration:  90 QT  Interval:  364 QTC Calculation: 401 R Axis:   67  Text Interpretation: Normal sinus rhythm with sinus arrhythmia Normal ECG When compared with ECG of 22-May-2023 16:06, PREVIOUS ECG IS PRESENT Since last tracing rate slower Confirmed by Sueellen Emery [phone]) on 07/18/2023 10:45:23 AM  Radiology DG Chest 2 View Result Date: 07/18/2023 CLINICAL DATA:  Chest pain EXAM: CHEST - 2 VIEW COMPARISON:  11/21/2022 FINDINGS: Dorsal column stimulator projects over the T8 and T9 vertebral levels, the electrodes span a 4.1 cm vertical excursion compared previous 8.1 cm vertical excursions indicating interval replacement (correlate with procedural history). The lungs appear clear. Cardiac and mediastinal contours normal. No blunting of the costophrenic angles. IMPRESSION: 1. No active cardiopulmonary disease is radiographically apparent. 2. Interval replacement of the dorsal column stimulator leads/electrodes. Electronically Signed   By: Freida Jes M.D.   On: 07/18/2023 09:39    Procedures Procedures    Medications Ordered in ED Medications  sodium chloride  0.9 % bolus 1,000 mL (0 mLs Intravenous Stopped 07/18/23 1302)  ketorolac  (TORADOL ) 30 MG/ML injection 30 mg (30 mg Intravenous Given 07/18/23 1135)  ipratropium-albuterol (DUONEB) 0.5-2.5 (3) MG/3ML nebulizer solution 3 mL (3 mLs Nebulization Given 07/18/23 1121)    ED Course/ Medical Decision Making/ A&P                                 Medical Decision Making Amount and/or Complexity of Data Reviewed Labs: ordered. Radiology: ordered.  Risk Prescription drug management.   This patient presents to the ED for concern of cp, this involves an extensive number of treatment options, and is a complaint that carries with it a high risk of complications and morbidity.  The differential diagnosis includes cardiac, pulm, gi   Co morbidities that complicate the patient evaluation  asthma, migraines, and chronic pain   Additional history  obtained:  Additional history obtained from epic chart review External records from outside source obtained and reviewed including wife   Lab Tests:  I Ordered, and personally interpreted labs.  The pertinent results include:  cbc nl, bmp nl other than mild hypokalemia at 3.3, trop nl, ddimer neg   Imaging Studies ordered:  I ordered imaging studies including cxr  I independently visualized and interpreted imaging which showed  No active cardiopulmonary disease is radiographically apparent.  2. Interval replacement of the dorsal column stimulator  leads/electrodes.   I agree with the radiologist interpretation   Cardiac Monitoring:  The patient was maintained on a cardiac monitor.  I personally viewed and interpreted the cardiac monitored which showed an underlying rhythm of: nsr   Medicines ordered and prescription drug management:  I ordered medication including ivfs/toradol   for sx  Reevaluation of the patient after these medicines showed that the patient improved I have reviewed the patients home medicines and have made adjustments as needed   Test Considered:  ct   Critical Interventions:  meds   Problem List / ED Course:  Atypical cp:  cardiac work up neg.  Sx started after taking new migraine medications.  Pt told to f/u with pcp to see about changing the meds. Pt has no pain now.  He is stable for d/c.  Return if worse.  F/u with pcp.   Reevaluation:  After the interventions noted above, I reevaluated the patient and found that they have :improved   Social Determinants of Health:  Lives at home   Dispostion:  After consideration of the diagnostic results and the patients response to treatment, I feel that the patent would benefit from discharge with outpatient f/u.          Final Clinical Impression(s) / ED Diagnoses Final diagnoses:  Atypical chest pain    Rx / DC Orders ED Discharge Orders     None         Sueellen Emery,  MD 07/18/23 1308

## 2023-07-23 ENCOUNTER — Ambulatory Visit
Admission: RE | Admit: 2023-07-23 | Discharge: 2023-07-23 | Disposition: A | Discharge status: Home / Self Care | Source: Ambulatory Visit | Attending: Pain Medicine | Admitting: Pain Medicine

## 2023-07-23 DIAGNOSIS — M792 Neuralgia and neuritis, unspecified: Secondary | ICD-10-CM

## 2023-10-19 ENCOUNTER — Encounter (HOSPITAL_COMMUNITY): Payer: Self-pay

## 2023-10-19 ENCOUNTER — Emergency Department (HOSPITAL_COMMUNITY): Admission: EM | Admit: 2023-10-19 | Discharge: 2023-10-19 | Disposition: A | Discharge status: Home / Self Care

## 2023-10-19 ENCOUNTER — Other Ambulatory Visit: Payer: Self-pay

## 2023-10-19 ENCOUNTER — Emergency Department (HOSPITAL_COMMUNITY)

## 2023-10-19 DIAGNOSIS — M545 Low back pain, unspecified: Secondary | ICD-10-CM | POA: Diagnosis present

## 2023-10-19 DIAGNOSIS — M5441 Lumbago with sciatica, right side: Secondary | ICD-10-CM | POA: Insufficient documentation

## 2023-10-19 LAB — URINALYSIS, ROUTINE W REFLEX MICROSCOPIC
Bilirubin Urine: NEGATIVE
Glucose, UA: NEGATIVE mg/dL
Hgb urine dipstick: NEGATIVE
Ketones, ur: NEGATIVE mg/dL
Leukocytes,Ua: NEGATIVE
Nitrite: NEGATIVE
Protein, ur: NEGATIVE mg/dL
Specific Gravity, Urine: 1.017 (ref 1.005–1.030)
pH: 7 (ref 5.0–8.0)

## 2023-10-19 LAB — CBC
HCT: 50.1 % (ref 39.0–52.0)
Hemoglobin: 16.1 g/dL (ref 13.0–17.0)
MCH: 27.8 pg (ref 26.0–34.0)
MCHC: 32.1 g/dL (ref 30.0–36.0)
MCV: 86.5 fL (ref 80.0–100.0)
Platelets: 318 K/uL (ref 150–400)
RBC: 5.79 MIL/uL (ref 4.22–5.81)
RDW: 13.4 % (ref 11.5–15.5)
WBC: 6 K/uL (ref 4.0–10.5)
nRBC: 0 % (ref 0.0–0.2)

## 2023-10-19 LAB — BASIC METABOLIC PANEL WITH GFR
Anion gap: 16 — ABNORMAL HIGH (ref 5–15)
BUN: 11 mg/dL (ref 6–20)
CO2: 22 mmol/L (ref 22–32)
Calcium: 9.7 mg/dL (ref 8.9–10.3)
Chloride: 106 mmol/L (ref 98–111)
Creatinine, Ser: 0.95 mg/dL (ref 0.61–1.24)
GFR, Estimated: >60 mL/min (ref >60)
Glucose, Bld: 89 mg/dL (ref 70–99)
Potassium: 4 mmol/L (ref 3.5–5.1)
Sodium: 143 mmol/L (ref 135–145)

## 2023-10-19 MED ORDER — MORPHINE SULFATE (PF) 4 MG/ML IV SOLN
4.0000 mg | Freq: Once | INTRAVENOUS | Status: AC
  Administered 2023-10-19: 4 mg via INTRAVENOUS
  Filled 2023-10-19: qty 1

## 2023-10-19 MED ORDER — LIDOCAINE 5 % EX PTCH
2.0000 | MEDICATED_PATCH | CUTANEOUS | Status: DC
  Administered 2023-10-19: 2 via TRANSDERMAL
  Filled 2023-10-19: qty 2

## 2023-10-19 MED ORDER — PREDNISONE 10 MG (21) PO TBPK: ORAL_TABLET | Freq: Every day | ORAL | 0 refills | Status: AC

## 2023-10-19 MED ORDER — KETOROLAC TROMETHAMINE 15 MG/ML IJ SOLN
15.0000 mg | Freq: Once | INTRAMUSCULAR | Status: AC
  Administered 2023-10-19: 15 mg via INTRAVENOUS
  Filled 2023-10-19: qty 1

## 2023-10-19 MED ORDER — METHOCARBAMOL 500 MG PO TABS
1000.0000 mg | ORAL_TABLET | Freq: Once | ORAL | Status: AC
  Administered 2023-10-19: 1000 mg via ORAL
  Filled 2023-10-19: qty 2

## 2023-10-19 MED ORDER — LORAZEPAM 1 MG PO TABS
1.0000 mg | ORAL_TABLET | Freq: Once | ORAL | Status: AC
  Administered 2023-10-19: 1 mg via ORAL
  Filled 2023-10-19: qty 1

## 2023-10-19 NOTE — ED Provider Notes (Signed)
 Received patient in signout from previous provider pending MRI lumbar spine.  See his note.  In short, patient presents to the emergency department for evaluation of lower back pain that has been persisting for the past 3 weeks and worsened over this weekend when doing some yard work.  Has associated urinary hesitancy, feeling of retention.  Takes Percocet, tramadol, Lyrica for chronic pain.  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  Falls [name], MD neurosurgery.  ED workup notable for mild elevated anion gap at 16 with no leukocytosis nor infection nor Hgb and UA.  Was provided Toradol , lidocaine  patch, Robaxin , morphine , Ativan  for pain.  Postvoid bladder scan noted 15 mL.  FMP:Fnizmjuz-[id number] right subarticular disc extrusion at L5-S1, impinging upon the descending right S1 nerve root in the right lateral recess. This is increased in size as compared to 07/23/2023. Small left subarticular disc protrusion at L4-5 with resultant mild left lateral recess stenosis  Consulted neurosurgery Dr. [name] who recommended outpatient f/u with Dr. [name], steroid burst with no acute neuro deficits  Pain management to handle analgesia  Discussed ED workup, disposition, return to ED precautions with patient who expresses understanding agrees with plan.  All questions answered to their satisfaction.  They are agreeable to plan.  Discharge instructions provided on paperwork   Minnie Tinnie BRAVO, PA 10/20/23 0000    Darra Fonda MATSU, MD 10/23/23 [phone]

## 2023-10-19 NOTE — Discharge Instructions (Addendum)
 Your MRI does not show any acute compression of your spinal cord but looks like there is worsening of disc herniation.  Follow-up with your neurosurgeon.  Please take the prednisone  as prescribed for the full course.  Continue to monitor your symptoms and follow-up with your outpatient team including her primary care doctor.

## 2023-10-19 NOTE — ED Triage Notes (Signed)
 Lower back pain for 3 weeks that started worsening Sunday. Pt states movement makes it worse. Pain radiates down right leg. Pt states he has been having some difficulty urinating. Little relief with at home lyrica, tramadol, or oxycodone 

## 2023-10-19 NOTE — ED Provider Notes (Signed)
 [place] EMERGENCY DEPARTMENT AT [facility] Provider Note   CSN: [id number] Arrival date & time: 10/19/23  1100     Patient presents with: Back Pain   [name] [name] is a 43 y.o. male.    Back Pain  Patient is a 43 year old male with past medical history significant for chronic pain, back pain, asthma  Patient presents emergency room today with complaints of approximately 3 weeks of persistent low back pain.  Seems that over the weekend patient did some yard work and has had significant worsening in his back pain.  He indicates that he has had some urinary hesitancy he denies any dysuria or urinary frequency.  He states he is also having some trouble relaxing in order to urinate because of that how severe the pain is in his low back. Patient does follow with pain management and takes Percocet, tramadol, Lyrica for his chronic pain.  Denies any abdominal pain, nausea, vomiting, fever, history of IV drug use, cancer history, chronic steroid use, weight changes, denies any limb weakness or sensory changes.  No traumatic injuries or falls.    Prior to Admission medications   Medication Sig Start Date End Date Taking? Authorizing Provider  predniSONE  (STERAPRED UNI-PAK 21 TAB) 10 MG (21) TBPK tablet Take by mouth daily. Take 6 tabs by mouth daily  for 2 days, then 5 tabs for 2 days, then 4 tabs for 2 days, then 3 tabs for 2 days, 2 tabs for 2 days, then 1 tab by mouth daily for 2 days 10/19/23  Yes Cielo Arias S, PA  baclofen  (LIORESAL ) 10 MG tablet Take 5 mg by mouth at bedtime. 01/30/19   [provider]  butalbital -acetaminophen -caffeine  (FIORICET, ESGIC) 50-325-40 MG tablet Take 1-2 tablets by mouth every 6 (six) hours as needed for headache. 04/25/17   Robinson, [place] N, PA-C  diphenhydrAMINE  (BENADRYL ) 25 MG tablet Take 25 mg by mouth every 6 (six) hours as needed for itching.    [provider]  docusate sodium  (COLACE) 100 MG capsule Take 100 mg by  mouth daily as needed for mild constipation.    [provider]  DULoxetine  (CYMBALTA ) 60 MG capsule Take 120 mg by mouth daily. 03/15/19   [provider]  eletriptan (RELPAX) 40 MG tablet Take 40 mg by mouth every 2 (two) hours as needed for migraine or headache. May repeat in 2 hours if headache persists or recurs.    [provider]  gabapentin  (NEURONTIN ) 600 MG tablet Take 300 mg by mouth 3 (three) times daily. 03/03/19   [provider]  HYDROcodone -acetaminophen  (NORCO/VICODIN) 5-325 MG tablet Take 1-2 tablets by mouth every 6 (six) hours as needed for moderate pain. 08/13/16   Steinl, Kevin, MD  methocarbamol  (ROBAXIN ) 500 MG tablet Take 1 tablet (500 mg total) by mouth 2 (two) times daily. 12/13/20   Neldon Hamp RAMAN, PA  omeprazole (PRILOSEC) 20 MG capsule Take 40 mg by mouth daily.    [provider]  ondansetron  (ZOFRAN ) 4 MG tablet Take 1 tablet (4 mg total) by mouth every 6 (six) hours as needed for nausea. 04/04/19   Carolee Lynwood PARAS III, MD  ondansetron  (ZOFRAN -ODT) 4 MG disintegrating tablet 4mg  ODT q4 hours prn nausea/vomit 05/22/23   Lenor Hollering, MD  oxymetazoline (AFRIN) 0.05 % nasal spray Place 1 spray into both nostrils daily.    [provider]  polyethylene glycol (MIRALAX / GLYCOLAX) packet Take 17 g by mouth every other day.  [provider]  pregabalin (LYRICA) 50 MG capsule Take 50 mg by mouth 2 (two) times daily as needed.    [provider]  sodium chloride  (OCEAN) 0.65 % SOLN nasal spray Place 1 spray into both nostrils daily.    [provider]  SUMAtriptan Succinate (ONZETRA XSAIL) 11 MG/NOSEPC EXHP Place 1 spray into both nostrils daily as needed (migraine).    [provider]  traMADol (ULTRAM) 50 MG tablet Take 50 mg by mouth 3 (three) times daily. 03/19/17   [provider]  traZODone  (DESYREL ) 50 MG tablet Take 50 mg by mouth at bedtime. 02/13/19   [provider]  Triamcinolone  Acetonide (NASACORT  ALLERGY 24HR NA) Place 1 spray into both nostrils daily.    [provider]  VITAMIN D PO Take 1 tablet by mouth daily.    [provider]    Allergies: Hydrocodone , Oxycodone , Cinnamon, and Compazine  [prochlorperazine ]    Review of Systems  Musculoskeletal:  Positive for back pain.    Updated Vital Signs BP 124/77 (BP Location: Left Arm)   Pulse 78   Temp 98.1 F (36.7 C) (Oral)   Resp 18   Ht 5' 8 (1.727 m)   Wt 86.2 kg   SpO2 95%   BMI 28.89 kg/m   Physical Exam Vitals and nursing note reviewed.  Constitutional:      General: He is not in acute distress. HENT:     Head: Normocephalic and atraumatic.     Nose: Nose normal.     Mouth/Throat:     Mouth: Mucous membranes are moist.  Eyes:     General: No scleral icterus. Cardiovascular:     Rate and Rhythm: Normal rate and regular rhythm.     Pulses: Normal pulses.     Heart sounds: Normal heart sounds.  Pulmonary:     Effort: Pulmonary effort is normal. No respiratory distress.     Breath sounds: No wheezing.  Abdominal:     Palpations: Abdomen is soft.     Tenderness: There is no abdominal tenderness.  Musculoskeletal:     Cervical back: Normal range of motion.     Right lower leg: No edema.     Left lower leg: No edema.     Comments: Some midline lumbar and paravertebral muscular tenderness on exam  Skin:    General: Skin is warm and dry.     Capillary Refill: Capillary refill takes less than 2 seconds.  Neurological:     Mental Status: He is alert. Mental status is at baseline.     Comments: Left-sided foot drop, right foot strength normal  Bilateral patellar reflexes normal  Sensation bilateral legs normal  Psychiatric:        Mood and Affect: Mood normal.        Behavior: Behavior normal.     (all labs ordered are listed, but only abnormal results are displayed) Labs Reviewed  BASIC METABOLIC PANEL WITH GFR - Abnormal; Notable  for the following components:      Result Value   Anion gap 16 (*)    All other components within normal limits  CBC  URINALYSIS, ROUTINE W REFLEX MICROSCOPIC    EKG: None  Radiology: No results found.   Procedures   Medications Ordered in the ED  lidocaine  (LIDODERM ) 5 % 2 patch (2 patches Transdermal Patch Applied 10/19/23 1424)  LORazepam  (ATIVAN ) tablet 1 mg (1 mg Oral Given 10/19/23 1303)  morphine  (PF) 4 MG/ML injection 4 mg (  4 mg Intravenous Given 10/19/23 1314)  ketorolac  (TORADOL ) 15 MG/ML injection 15 mg (15 mg Intravenous Given 10/19/23 1424)  methocarbamol  (ROBAXIN ) tablet 1,000 mg (1,000 mg Oral Given 10/19/23 1423)                                    Medical Decision Making Amount and/or Complexity of Data Reviewed Labs: ordered. Radiology: ordered.  Risk Prescription drug management.    Patient is a 43 year old male with past medical history significant for chronic pain, back pain, asthma  Patient presents emergency room today with complaints of approximately 3 weeks of persistent low back pain.  Seems that over the weekend patient did some yard work and has had significant worsening in his back pain.  He indicates that he has had some urinary hesitancy he denies any dysuria or urinary frequency.  He states he is also having some trouble relaxing in order to urinate because of that how severe the pain is in his low back. Patient does follow with pain management and takes Percocet, tramadol, Lyrica for his chronic pain.  Denies any abdominal pain, nausea, vomiting, fever, history of IV drug use, cancer history, chronic steroid use, weight changes, denies any limb weakness or sensory changes.  No traumatic injuries or falls. ---------------------  Broad differential for back pain considered includes malignancy, disc herniation, spinal epidural abscess, spinal fracture, cauda equina, pyelonephritis, kidney stone, AAA, AD, pancreatitis, PE and PTX.   History  without symptoms of urinary or stool retention or incontinence, neurologic changes such as sensation change or weakness lower extremities, coagulopathy or blood thinner use, is not elderly or with history of osteoporosis, denies any history of cancer, fever, IV drug use, weight changes (unexplained), or prolonged steroid use.   Physical exam most consistent with muscular strain. Doubt cauda equina or disc herniation d/t lack of saddle anesthesia/bowel or bladder incontinence or urinary retention, normal gait and reassuring physical examination without neurologic deficits.   History is not supportive of kidney stone, AAA, AD, pancreatitis, PE or PTX. Patient has no CVA tenderness or urinary sx to suggest pyelonephritis or kidney stone.   Will manage patient conservatively at this time. NSAIDs, back exercises/stretches, heat therapy and follow up with PCP if symptoms do not resolve in 3-4 weeks. Patient offered muscle relaxer for comfort at night. Counseled on need to return to ED for fever, worsening or concerning symptoms. Patient agreeable to plan and states understanding of follow up plans and return precautions.   Specifically patient has a postvoid residual of 15 mL, I have low suspicion for his urinary symptoms indicating/representing cauda equina.  He has no saddle anesthesia which is reassuring.  He feels much improved after medications here in the emergency department.  Vitals WNL at time of discharge and patient is no acute distress   Will discharge home for follow-up with primary care, pain management and may benefit from neurosurgery outpatient follow-up.  Patient care signed out to evening team with plan to follow-up on MRI anticipate discharge home with patient's home pain medications and prednisone .     Final diagnoses:  Acute midline low back pain with right-sided sciatica    ED Discharge Orders          Ordered    predniSONE  (STERAPRED UNI-PAK 21 TAB) 10 MG (21) TBPK tablet   Daily        10/19/23 1550  Neldon Hamp RAMAN, [place] 10/19/23 1552    Neysa Caron PARAS, DO 10/19/23 1557
